# Patient Record
Sex: Female | Born: 1975 | Race: Black or African American | Hispanic: No | Marital: Single | Smoking: Current every day smoker
Health system: Southern US, Community
[De-identification: ages and names within clinical notes are randomized; demographics above are authoritative.]

## PROBLEM LIST (undated history)

## (undated) DIAGNOSIS — M549 Dorsalgia, unspecified: Secondary | ICD-10-CM

## (undated) DIAGNOSIS — J40 Bronchitis, not specified as acute or chronic: Secondary | ICD-10-CM

## (undated) DIAGNOSIS — G8929 Other chronic pain: Secondary | ICD-10-CM

## (undated) DIAGNOSIS — F329 Major depressive disorder, single episode, unspecified: Secondary | ICD-10-CM

## (undated) DIAGNOSIS — M1990 Unspecified osteoarthritis, unspecified site: Secondary | ICD-10-CM

## (undated) DIAGNOSIS — F32A Depression, unspecified: Secondary | ICD-10-CM

## (undated) HISTORY — PX: CHEST TUBE INSERTION: SHX231

## (undated) HISTORY — DX: Unspecified osteoarthritis, unspecified site: M19.90

## (undated) HISTORY — PX: HERNIA REPAIR: SHX51

## (undated) HISTORY — DX: Major depressive disorder, single episode, unspecified: F32.9

## (undated) HISTORY — DX: Depression, unspecified: F32.A

## (undated) HISTORY — PX: TRACHEOSTOMY CLOSURE: SHX6132

## (undated) HISTORY — PX: TRACHEOSTOMY: SUR1362

---

## 2014-06-02 ENCOUNTER — Encounter (HOSPITAL_COMMUNITY): Payer: Self-pay

## 2014-06-02 ENCOUNTER — Emergency Department (HOSPITAL_COMMUNITY)
Admission: EM | Admit: 2014-06-02 | Discharge: 2014-06-02 | Disposition: A | Discharge status: Home / Self Care | Payer: Medicare (Managed Care) | Attending: Emergency Medicine | Admitting: Emergency Medicine

## 2014-06-02 DIAGNOSIS — M25561 Pain in right knee: Secondary | ICD-10-CM | POA: Diagnosis not present

## 2014-06-02 DIAGNOSIS — E119 Type 2 diabetes mellitus without complications: Secondary | ICD-10-CM | POA: Diagnosis not present

## 2014-06-02 DIAGNOSIS — Y929 Unspecified place or not applicable: Secondary | ICD-10-CM | POA: Insufficient documentation

## 2014-06-02 DIAGNOSIS — Y999 Unspecified external cause status: Secondary | ICD-10-CM | POA: Diagnosis not present

## 2014-06-02 DIAGNOSIS — Y9389 Activity, other specified: Secondary | ICD-10-CM | POA: Diagnosis not present

## 2014-06-02 DIAGNOSIS — Z720 Tobacco use: Secondary | ICD-10-CM | POA: Diagnosis not present

## 2014-06-02 DIAGNOSIS — M549 Dorsalgia, unspecified: Secondary | ICD-10-CM | POA: Diagnosis present

## 2014-06-02 DIAGNOSIS — X58XXXA Exposure to other specified factors, initial encounter: Secondary | ICD-10-CM | POA: Diagnosis not present

## 2014-06-02 DIAGNOSIS — S39012A Strain of muscle, fascia and tendon of lower back, initial encounter: Secondary | ICD-10-CM | POA: Diagnosis not present

## 2014-06-02 DIAGNOSIS — M25562 Pain in left knee: Secondary | ICD-10-CM | POA: Diagnosis not present

## 2014-06-02 DIAGNOSIS — G8929 Other chronic pain: Secondary | ICD-10-CM | POA: Diagnosis not present

## 2014-06-02 MED ORDER — CYCLOBENZAPRINE HCL 10 MG PO TABS: 10.0000 mg | ORAL_TABLET | Freq: Two times a day (BID) | ORAL | Status: DC | PRN

## 2014-06-02 MED ORDER — MELOXICAM 15 MG PO TABS: 15.0000 mg | ORAL_TABLET | Freq: Every day | ORAL | Status: DC

## 2014-06-02 NOTE — ED Notes (Signed)
Patient states she has tried all OTC medicines and nothing works.   Patient states when cold and rainy, she hurts worse.
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
## 2014-06-02 NOTE — Discharge Instructions (Signed)
Take Mobic as needed for pain. Take flexeril as needed for muscle strain. Refer to attached documents for more information.

## 2014-06-11 ENCOUNTER — Emergency Department (HOSPITAL_COMMUNITY)
Admission: EM | Admit: 2014-06-11 | Discharge: 2014-06-11 | Disposition: A | Discharge status: Home / Self Care | Payer: Medicare (Managed Care) | Attending: Emergency Medicine | Admitting: Emergency Medicine

## 2014-06-11 ENCOUNTER — Encounter (HOSPITAL_COMMUNITY): Payer: Self-pay

## 2014-06-11 DIAGNOSIS — E119 Type 2 diabetes mellitus without complications: Secondary | ICD-10-CM | POA: Insufficient documentation

## 2014-06-11 DIAGNOSIS — M545 Low back pain: Secondary | ICD-10-CM | POA: Insufficient documentation

## 2014-06-11 DIAGNOSIS — Z87828 Personal history of other (healed) physical injury and trauma: Secondary | ICD-10-CM | POA: Diagnosis not present

## 2014-06-11 DIAGNOSIS — Z720 Tobacco use: Secondary | ICD-10-CM | POA: Insufficient documentation

## 2014-06-11 DIAGNOSIS — M549 Dorsalgia, unspecified: Secondary | ICD-10-CM

## 2014-06-11 DIAGNOSIS — Z791 Long term (current) use of non-steroidal anti-inflammatories (NSAID): Secondary | ICD-10-CM | POA: Diagnosis not present

## 2014-06-11 DIAGNOSIS — G8929 Other chronic pain: Secondary | ICD-10-CM | POA: Insufficient documentation

## 2014-06-11 MED ORDER — OXYCODONE-ACETAMINOPHEN 5-325 MG PO TABS
2.0000 | ORAL_TABLET | Freq: Once | ORAL | Status: AC
  Administered 2014-06-11: 2 via ORAL
  Filled 2014-06-11: qty 2

## 2014-06-11 MED ORDER — OXYCODONE-ACETAMINOPHEN 5-325 MG PO TABS: 1.0000 | ORAL_TABLET | Freq: Three times a day (TID) | ORAL | Status: DC | PRN

## 2014-06-11 NOTE — ED Notes (Signed)
PA Browning at bedside. 

## 2014-06-11 NOTE — Discharge Instructions (Signed)
Back Pain, Adult Low back pain is very common. About 1 in 5 people have back pain.The cause of low back pain is rarely dangerous. The pain often gets better over time.About half of people with a sudden onset of back pain feel better in just 2 weeks. About 8 in 10 people feel better by 6 weeks.  CAUSES Some common causes of back pain include:  Strain of the muscles or ligaments supporting the spine.  Wear and tear (degeneration) of the spinal discs.  Arthritis.  Direct injury to the back. DIAGNOSIS Most of the time, the direct cause of low back pain is not known.However, back pain can be treated effectively even when the exact cause of the pain is unknown.Answering your caregiver's questions about your overall health and symptoms is one of the most accurate ways to make sure the cause of your pain is not dangerous. If your caregiver needs more information, he or she may order lab work or imaging tests (X-rays or MRIs).However, even if imaging tests show changes in your back, this usually does not require surgery. HOME CARE INSTRUCTIONS For many people, back pain returns.Since low back pain is rarely dangerous, it is often a condition that people can learn to manageon their own.   Remain active. It is stressful on the back to sit or stand in one place. Do not sit, drive, or stand in one place for more than 30 minutes at a time. Take short walks on level surfaces as soon as pain allows.Try to increase the length of time you walk each day.  Do not stay in bed.Resting more than 1 or 2 days can delay your recovery.  Do not avoid exercise or work.Your body is made to move.It is not dangerous to be active, even though your back may hurt.Your back will likely heal faster if you return to being active before your pain is gone.  Pay attention to your body when you bend and lift. Many people have less discomfortwhen lifting if they bend their knees, keep the load close to their bodies,and  avoid twisting. Often, the most comfortable positions are those that put less stress on your recovering back.  Find a comfortable position to sleep. Use a firm mattress and lie on your side with your knees slightly bent. If you lie on your back, put a pillow under your knees.  Only take over-the-counter or prescription medicines as directed by your caregiver. Over-the-counter medicines to reduce pain and inflammation are often the most helpful.Your caregiver may prescribe muscle relaxant drugs.These medicines help dull your pain so you can more quickly return to your normal activities and healthy exercise.  Put ice on the injured area.  Put ice in a plastic bag.  Place a towel between your skin and the bag.  Leave the ice on for 15-20 minutes, 03-04 times a day for the first 2 to 3 days. After that, ice and heat may be alternated to reduce pain and spasms.  Ask your caregiver about trying back exercises and gentle massage. This may be of some benefit.  Avoid feeling anxious or stressed.Stress increases muscle tension and can worsen back pain.It is important to recognize when you are anxious or stressed and learn ways to manage it.Exercise is a great option. SEEK MEDICAL CARE IF:  You have pain that is not relieved with rest or medicine.  You have pain that does not improve in 1 week.  You have new symptoms.  You are generally not feeling well. SEEK   IMMEDIATE MEDICAL CARE IF:   You have pain that radiates from your back into your legs.  You develop new bowel or bladder control problems.  You have unusual weakness or numbness in your arms or legs.  You develop nausea or vomiting.  You develop abdominal pain.  You feel faint. Document Released: 07/10/2005 Document Revised: 01/09/2012 Document Reviewed: 11/11/2013 [employer] Patient Information 2015 [employer]. This information is not intended to replace advice given to you by your health care provider. Make sure you  discuss any questions you have with your health care provider.  
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
## 2014-06-11 NOTE — ED Notes (Signed)
Was seen here on 10th and given flexeril and mobic for back pain. Not having any relief.

## 2014-07-07 ENCOUNTER — Encounter (HOSPITAL_COMMUNITY): Payer: Self-pay | Admitting: Emergency Medicine

## 2014-07-07 ENCOUNTER — Emergency Department (INDEPENDENT_AMBULATORY_CARE_PROVIDER_SITE_OTHER)
Admission: EM | Admit: 2014-07-07 | Discharge: 2014-07-07 | Disposition: A | Discharge status: Home / Self Care | Payer: PRIVATE HEALTH INSURANCE | Source: Home / Self Care | Attending: Emergency Medicine | Admitting: Emergency Medicine

## 2014-07-07 DIAGNOSIS — M62838 Other muscle spasm: Secondary | ICD-10-CM

## 2014-07-07 DIAGNOSIS — M79661 Pain in right lower leg: Secondary | ICD-10-CM

## 2014-07-07 DIAGNOSIS — M546 Pain in thoracic spine: Secondary | ICD-10-CM

## 2014-07-07 MED ORDER — OXYCODONE-ACETAMINOPHEN 5-325 MG PO TABS: 1.0000 | ORAL_TABLET | Freq: Four times a day (QID) | ORAL | Status: DC | PRN

## 2014-07-07 MED ORDER — CYCLOBENZAPRINE HCL 5 MG PO TABS: 5.0000 mg | ORAL_TABLET | Freq: Three times a day (TID) | ORAL | Status: DC | PRN

## 2014-07-07 NOTE — Discharge Instructions (Signed)
Back Pain, Adult Low back pain is very common. About 1 in 5 people have back pain.The cause of low back pain is rarely dangerous. The pain often gets better over time.About half of people with a sudden onset of back pain feel better in just 2 weeks. About 8 in 10 people feel better by 6 weeks.  CAUSES Some common causes of back pain include:  Strain of the muscles or ligaments supporting the spine.  Wear and tear (degeneration) of the spinal discs.  Arthritis.  Direct injury to the back. DIAGNOSIS Most of the time, the direct cause of low back pain is not known.However, back pain can be treated effectively even when the exact cause of the pain is unknown.Answering your caregiver's questions about your overall health and symptoms is one of the most accurate ways to make sure the cause of your pain is not dangerous. If your caregiver needs more information, he or she may order lab work or imaging tests (X-rays or MRIs).However, even if imaging tests show changes in your back, this usually does not require surgery. HOME CARE INSTRUCTIONS For many people, back pain returns.Since low back pain is rarely dangerous, it is often a condition that people can learn to [facility] their own.   Remain active. It is stressful on the back to sit or stand in one place. Do not sit, drive, or stand in one place for more than 30 minutes at a time. Take short walks on level surfaces as soon as pain allows.Try to increase the length of time you walk each day.  Do not stay in bed.Resting more than 1 or 2 days can delay your recovery.  Do not avoid exercise or work.Your body is made to move.It is not dangerous to be active, even though your back may hurt.Your back will likely heal faster if you return to being active before your pain is gone.  Pay attention to your body when you bend and lift. Many people have less discomfortwhen lifting if they bend their knees, keep the load close to their bodies,and  avoid twisting. Often, the most comfortable positions are those that put less stress on your recovering back.  Find a comfortable position to sleep. Use a firm mattress and lie on your side with your knees slightly bent. If you lie on your back, put a pillow under your knees.  Only take over-the-counter or prescription medicines as directed by your caregiver. Over-the-counter medicines to reduce pain and inflammation are often the most helpful.Your caregiver may prescribe muscle relaxant drugs.These medicines help dull your pain so you can more quickly return to your normal activities and healthy exercise.  Put ice on the injured area.  Put ice in a plastic bag.  Place a towel between your skin and the bag.  Leave the ice on for 15-20 minutes, 03-04 times a day for the first 2 to 3 days. After that, ice and heat may be alternated to reduce pain and spasms.  Ask your caregiver about trying back exercises and gentle massage. This may be of some benefit.  Avoid feeling anxious or stressed.Stress increases muscle tension and can worsen back pain.It is important to recognize when you are anxious or stressed and learn ways to manage it.Exercise is a great option. SEEK MEDICAL CARE IF:  You have pain that is not relieved with rest or medicine.  You have pain that does not improve in 1 week.  You have new symptoms.  You are generally not feeling well. SEEK  IMMEDIATE MEDICAL CARE IF:   You have pain that radiates from your back into your legs.  You develop new bowel or bladder control problems.  You have unusual weakness or numbness in your arms or legs.  You develop nausea or vomiting.  You develop abdominal pain.  You feel faint. Document Released: 07/10/2005 Document Revised: 01/09/2012 Document Reviewed: 11/11/2013 [facility] Patient Information 2015 [place], [place]. This information is not intended to replace advice given to you by your health care provider. Make sure you  discuss any questions you have with your health care provider.  Chronic Back Pain  When back pain lasts longer than 3 months, it is called chronic back pain.People with chronic back pain often go through certain periods that are more intense (flare-ups).  CAUSES Chronic back pain can be caused by wear and tear (degeneration) on different structures in your back. These structures include:  The bones of your spine (vertebrae) and the joints surrounding your spinal cord and nerve roots (facets).  The strong, fibrous tissues that connect your vertebrae (ligaments). Degeneration of these structures may result in pressure on your nerves. This can lead to constant pain. HOME CARE INSTRUCTIONS  Avoid bending, heavy lifting, prolonged sitting, and activities which make the problem worse.  Take brief periods of rest throughout the day to reduce your pain. Lying down or standing usually is better than sitting while you are resting.  Take over-the-counter or prescription medicines only as directed by your caregiver. SEEK IMMEDIATE MEDICAL CARE IF:   You have weakness or numbness in one of your legs or feet.  You have trouble controlling your bladder or bowels.  You have nausea, vomiting, abdominal pain, shortness of breath, or fainting. Document Released: 08/17/2004 Document Revised: 10/02/2011 Document Reviewed: 06/24/2011 [facility] Patient Information 2015 [place], [place]. This information is not intended to replace advice given to you by your health care provider. Make sure you discuss any questions you have with your health care provider.  Heat Therapy Heat therapy can help make painful, stiff muscles and joints feel better. Do not use heat on new injuries. Wait at least 48 hours after an injury to use heat. Do not use heat when you have aches or pains right after an activity. If you still have pain 3 hours after stopping the activity, then you may use heat. HOME CARE Wet heat pack  Soak a  clean towel in warm water. Squeeze out the extra water.  Put the warm, wet towel in a plastic bag.  Place a thin, dry towel between your skin and the bag.  Put the heat pack on the area for 5 minutes, and check your skin. Your skin may be pink, but it should not be red.  Leave the heat pack on the area for 15 to 30 minutes.  Repeat this every 2 to 4 hours while awake. Do not use heat while you are sleeping. Warm water bath  Fill a tub with warm water.  Place the affected body part in the tub.  Soak the area for 20 to 40 minutes.  Repeat as needed. Hot water bottle  Fill the water bottle half full with hot water.  Press out the extra air. Close the cap tightly.  Place a dry towel between your skin and the bottle.  Put the bottle on the area for 5 minutes, and check your skin. Your skin may be pink, but it should not be red.  Leave the bottle on the area for 15 to 30  minutes.  Repeat this every 2 to 4 hours while awake. Electric heating pad  Place a dry towel between your skin and the heating pad.  Set the heating pad on low heat.  Put the heating pad on the area for 10 minutes, and check your skin. Your skin may be pink, but it should not be red.  Leave the heating pad on the area for 20 to 40 minutes.  Repeat this every 2 to 4 hours while awake.  Do not lie on the heating pad.  Do not fall asleep while using the heating pad.  Do not use the heating pad near water. GET HELP RIGHT AWAY IF:  You get blisters or red skin.  Your skin is puffy (swollen), or you lose feeling (numbness) in the affected area.  You have any new problems.  Your problems are getting worse.  You have any questions or concerns. If you have any problems, stop using heat therapy until you see your doctor. MAKE SURE YOU:  Understand these instructions.  Will watch your condition.  Will get help right away if you are not doing well or get worse. Document Released: 10/02/2011  Document Reviewed: 09/02/2013 [facility] Patient Information 2015 [place], [place]. This information is not intended to replace advice given to you by your health care provider. Make sure you discuss any questions you have with your health care provider.  Muscle Cramps and Spasms Muscle cramps and spasms occur when a muscle or muscles tighten and you have no control over this tightening (involuntary muscle contraction). They are a common problem and can develop in any muscle. The most common place is in the calf muscles of the leg. Both muscle cramps and muscle spasms are involuntary muscle contractions, but they also have differences:   Muscle cramps are sporadic and painful. They may last a few seconds to a quarter of an hour. Muscle cramps are often more forceful and last longer than muscle spasms.  Muscle spasms may or may not be painful. They may also last just a few seconds or much longer. CAUSES  It is uncommon for cramps or spasms to be due to a serious underlying problem. In many cases, the cause of cramps or spasms is unknown. Some common causes are:   Overexertion.   Overuse from repetitive motions (doing the same thing over and over).   Remaining in a certain position for a long period of time.   Improper preparation, form, or technique while performing a sport or activity.   Dehydration.   Injury.   Side effects of some medicines.   Abnormally low levels of the salts and ions in your blood (electrolytes), especially potassium and calcium. This could happen if you are taking water pills (diuretics) or you are pregnant.  Some underlying medical problems can make it more likely to develop cramps or spasms. These include, but are not limited to:   Diabetes.   Parkinson disease.   Hormone disorders, such as thyroid problems.   Alcohol abuse.   Diseases specific to muscles, joints, and bones.   Blood vessel disease where not enough blood is getting to the  muscles.  HOME CARE INSTRUCTIONS   Stay well hydrated. Drink enough water and fluids to keep your urine clear or pale yellow.  It may be helpful to massage, stretch, and relax the affected muscle.  For tight or tense muscles, use a warm towel, heating pad, or hot shower water directed to the affected area.  If you are  sore or have pain after a cramp or spasm, applying ice to the affected area may relieve discomfort.  Put ice in a plastic bag.  Place a towel between your skin and the bag.  Leave the ice on for 15-20 minutes, 03-04 times a day.  Medicines used to treat a known cause of cramps or spasms may help reduce their frequency or severity. Only take over-the-counter or prescription medicines as directed by your caregiver. SEEK MEDICAL CARE IF:  Your cramps or spasms get more severe, more frequent, or do not improve over time.  MAKE SURE YOU:   Understand these instructions.  Will watch your condition.  Will get help right away if you are not doing well or get worse. Document Released: 12/30/2001 Document Revised: 11/04/2012 Document Reviewed: 06/26/2012 [facility] Patient Information 2015 [place], [place]. This information is not intended to replace advice given to you by your health care provider. Make sure you discuss any questions you have with your health care provider.
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
## 2014-08-08 ENCOUNTER — Encounter (HOSPITAL_COMMUNITY): Payer: Self-pay | Admitting: Emergency Medicine

## 2014-08-08 ENCOUNTER — Emergency Department (HOSPITAL_COMMUNITY)
Admission: EM | Admit: 2014-08-08 | Discharge: 2014-08-08 | Disposition: A | Discharge status: Home / Self Care | Payer: Medicare Other | Attending: Emergency Medicine | Admitting: Emergency Medicine

## 2014-08-08 ENCOUNTER — Emergency Department (HOSPITAL_COMMUNITY): Payer: Medicare Other

## 2014-08-08 DIAGNOSIS — Z791 Long term (current) use of non-steroidal anti-inflammatories (NSAID): Secondary | ICD-10-CM | POA: Diagnosis not present

## 2014-08-08 DIAGNOSIS — R110 Nausea: Secondary | ICD-10-CM | POA: Diagnosis not present

## 2014-08-08 DIAGNOSIS — Z79891 Long term (current) use of opiate analgesic: Secondary | ICD-10-CM | POA: Insufficient documentation

## 2014-08-08 DIAGNOSIS — E119 Type 2 diabetes mellitus without complications: Secondary | ICD-10-CM | POA: Diagnosis not present

## 2014-08-08 DIAGNOSIS — Z720 Tobacco use: Secondary | ICD-10-CM | POA: Diagnosis not present

## 2014-08-08 DIAGNOSIS — Z3202 Encounter for pregnancy test, result negative: Secondary | ICD-10-CM | POA: Insufficient documentation

## 2014-08-08 DIAGNOSIS — Z87828 Personal history of other (healed) physical injury and trauma: Secondary | ICD-10-CM | POA: Diagnosis not present

## 2014-08-08 DIAGNOSIS — K439 Ventral hernia without obstruction or gangrene: Secondary | ICD-10-CM | POA: Diagnosis not present

## 2014-08-08 DIAGNOSIS — Z79899 Other long term (current) drug therapy: Secondary | ICD-10-CM | POA: Insufficient documentation

## 2014-08-08 DIAGNOSIS — R102 Pelvic and perineal pain: Secondary | ICD-10-CM | POA: Diagnosis present

## 2014-08-08 LAB — COMPREHENSIVE METABOLIC PANEL
ALK PHOS: 97 U/L (ref 39–117)
ALT: 15 U/L (ref 0–35)
ANION GAP: 9 (ref 5–15)
AST: 21 U/L (ref 0–37)
Albumin: 3.8 g/dL (ref 3.5–5.2)
BUN: 5 mg/dL — ABNORMAL LOW (ref 6–23)
CO2: 24 mmol/L (ref 19–32)
Calcium: 9.1 mg/dL (ref 8.4–10.5)
Chloride: 106 mEq/L (ref 96–112)
Creatinine, Ser: 0.67 mg/dL (ref 0.50–1.10)
GFR calc Af Amer: >90 mL/min (ref >90)
GFR calc non Af Amer: >90 mL/min (ref >90)
GLUCOSE: 134 mg/dL — AB (ref 70–99)
POTASSIUM: 3.5 mmol/L (ref 3.5–5.1)
Sodium: 139 mmol/L (ref 135–145)
Total Bilirubin: 0.4 mg/dL (ref 0.3–1.2)
Total Protein: 6.3 g/dL (ref 6.0–8.3)

## 2014-08-08 LAB — PREGNANCY, URINE: Preg Test, Ur: NEGATIVE

## 2014-08-08 LAB — CBC WITH DIFFERENTIAL/PLATELET
BASOS PCT: 0 % (ref 0–1)
Basophils Absolute: 0 10*3/uL (ref 0.0–0.1)
Eosinophils Absolute: 0.1 10*3/uL (ref 0.0–0.7)
Eosinophils Relative: 2 % (ref 0–5)
HCT: 36 % (ref 36.0–46.0)
Hemoglobin: 12.1 g/dL (ref 12.0–15.0)
LYMPHS ABS: 2.4 10*3/uL (ref 0.7–4.0)
Lymphocytes Relative: 32 % (ref 12–46)
MCH: 30 pg (ref 26.0–34.0)
MCHC: 33.6 g/dL (ref 30.0–36.0)
MCV: 89.3 fL (ref 78.0–100.0)
MONO ABS: 0.6 10*3/uL (ref 0.1–1.0)
MONOS PCT: 8 % (ref 3–12)
NEUTROS PCT: 58 % (ref 43–77)
Neutro Abs: 4.4 10*3/uL (ref 1.7–7.7)
Platelets: 242 10*3/uL (ref 150–400)
RBC: 4.03 MIL/uL (ref 3.87–5.11)
RDW: 13 % (ref 11.5–15.5)
WBC: 7.4 10*3/uL (ref 4.0–10.5)

## 2014-08-08 LAB — URINALYSIS, ROUTINE W REFLEX MICROSCOPIC
BILIRUBIN URINE: NEGATIVE
Glucose, UA: NEGATIVE mg/dL
KETONES UR: NEGATIVE mg/dL
LEUKOCYTES UA: NEGATIVE
Nitrite: NEGATIVE
PH: 7 (ref 5.0–8.0)
Protein, ur: NEGATIVE mg/dL
Specific Gravity, Urine: 1.026 (ref 1.005–1.030)
Urobilinogen, UA: 1 mg/dL (ref 0.0–1.0)

## 2014-08-08 LAB — URINE MICROSCOPIC-ADD ON

## 2014-08-08 MED ORDER — DOXYCYCLINE HYCLATE 100 MG PO CAPS: 100.0000 mg | ORAL_CAPSULE | Freq: Two times a day (BID) | ORAL | Status: DC

## 2014-08-08 MED ORDER — HYDROMORPHONE HCL 1 MG/ML IJ SOLN
1.0000 mg | Freq: Once | INTRAMUSCULAR | Status: AC
  Administered 2014-08-08: 1 mg via INTRAVENOUS
  Filled 2014-08-08: qty 1

## 2014-08-08 MED ORDER — SODIUM CHLORIDE 0.9 % IV BOLUS (SEPSIS)
1000.0000 mL | Freq: Once | INTRAVENOUS | Status: AC
  Administered 2014-08-08: 1000 mL via INTRAVENOUS

## 2014-08-08 MED ORDER — IOHEXOL 300 MG/ML  SOLN
100.0000 mL | Freq: Once | INTRAMUSCULAR | Status: AC | PRN
  Administered 2014-08-08: 100 mL via INTRAVENOUS

## 2014-08-08 MED ORDER — OXYCODONE-ACETAMINOPHEN 5-325 MG PO TABS: 1.0000 | ORAL_TABLET | Freq: Two times a day (BID) | ORAL | Status: DC | PRN

## 2014-08-08 MED ORDER — AMOXICILLIN-POT CLAVULANATE 875-125 MG PO TABS: 1.0000 | ORAL_TABLET | Freq: Two times a day (BID) | ORAL | Status: DC

## 2014-08-08 MED ORDER — DOXYCYCLINE HYCLATE 100 MG PO TABS
100.0000 mg | ORAL_TABLET | Freq: Once | ORAL | Status: AC
  Administered 2014-08-08: 100 mg via ORAL
  Filled 2014-08-08: qty 1

## 2014-08-08 MED ORDER — AMOXICILLIN-POT CLAVULANATE 875-125 MG PO TABS
1.0000 | ORAL_TABLET | Freq: Once | ORAL | Status: AC
  Administered 2014-08-08: 1 via ORAL
  Filled 2014-08-08: qty 1

## 2014-08-08 NOTE — ED Notes (Signed)
Patient here with complaint of pelvic pain with swollen area on left side of groin. States history of 6x hernia repairs with most recent in 2014. States nausea. Denies vomiting and fevers. Last BM 08/07/14
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
## 2014-08-08 NOTE — Discharge Instructions (Signed)
Wound Infection Ms. Joanne Daugherty, you were seen today for abdominal pain. Your CT scan shows a soft tissue infection of your abdomen. Take antibiotics as prescribed and follow-up with general surgery within 3 days for continued management. If your symptoms worsen come back to emergency department immediately. Thank you. A wound infection happens when a type of germ (bacteria) grows in a wound. Caring for the infection can help the wound heal. Wound infections need treatment. HOME CARE   Only take medicine as told by your doctor.  Take your antibiotic medicine as told. Finish it even if you start to feel better.  Clean the wound with mild soap and water as told. Rinse the soap off. Pat the area dry with a clean towel. Do not rub the wound.  Change any bandages (dressings) as told by your doctor.  Put cream and a bandage on the wound as told by your doctor.  If the bandage sticks, wet it with soapy water to remove the bandage.  Change the bandage if it gets wet, dirty, or starts to smell.  Take showers. Do not take baths, swim, or do anything that puts your wound under water.  Avoid exercise that makes you sweat.  If your wound itches, use a medicine that helps stop itching. Do not pick or scratch at the wound.  Keep all doctor visits as told. GET HELP RIGHT AWAY IF:   You have more puffiness (swelling), pain, or redness around the wound.  You have more yellowish-white fluid (pus) coming from the wound.  You have a bad smell coming from the wound.  Your wound breaks open more.  You have a fever. MAKE SURE YOU:   Understand these instructions.  Will watch your condition.  Will get help right away if you are not doing well or get worse. Document Released: 04/18/2008 Document Revised: 10/02/2011 Document Reviewed: 12/19/2010 [facility] Patient Information 2015 [employer] [place]. This information is not intended to replace advice given to you by your health care provider. Make sure you  discuss any questions you have with your health care provider.

## 2014-11-15 ENCOUNTER — Emergency Department (HOSPITAL_COMMUNITY)
Admission: EM | Admit: 2014-11-15 | Discharge: 2014-11-15 | Disposition: A | Discharge status: Home / Self Care | Payer: Medicare Other | Attending: Emergency Medicine | Admitting: Emergency Medicine

## 2014-11-15 ENCOUNTER — Encounter (HOSPITAL_COMMUNITY): Payer: Self-pay | Admitting: *Deleted

## 2014-11-15 DIAGNOSIS — M5441 Lumbago with sciatica, right side: Secondary | ICD-10-CM | POA: Diagnosis not present

## 2014-11-15 DIAGNOSIS — Z87828 Personal history of other (healed) physical injury and trauma: Secondary | ICD-10-CM | POA: Diagnosis not present

## 2014-11-15 DIAGNOSIS — E119 Type 2 diabetes mellitus without complications: Secondary | ICD-10-CM | POA: Diagnosis not present

## 2014-11-15 DIAGNOSIS — M545 Low back pain: Secondary | ICD-10-CM | POA: Diagnosis present

## 2014-11-15 DIAGNOSIS — G8929 Other chronic pain: Secondary | ICD-10-CM | POA: Insufficient documentation

## 2014-11-15 DIAGNOSIS — M5442 Lumbago with sciatica, left side: Secondary | ICD-10-CM | POA: Insufficient documentation

## 2014-11-15 DIAGNOSIS — Z720 Tobacco use: Secondary | ICD-10-CM | POA: Diagnosis not present

## 2014-11-15 DIAGNOSIS — Z792 Long term (current) use of antibiotics: Secondary | ICD-10-CM | POA: Insufficient documentation

## 2014-11-15 MED ORDER — METHOCARBAMOL 500 MG PO TABS: 1000.0000 mg | ORAL_TABLET | Freq: Four times a day (QID) | ORAL | Status: DC

## 2014-11-15 MED ORDER — OXYCODONE-ACETAMINOPHEN 5-325 MG PO TABS
2.0000 | ORAL_TABLET | Freq: Once | ORAL | Status: AC
  Administered 2014-11-15: 2 via ORAL
  Filled 2014-11-15: qty 2

## 2014-11-15 MED ORDER — IBUPROFEN 800 MG PO TABS: 800.0000 mg | ORAL_TABLET | Freq: Three times a day (TID) | ORAL | Status: DC

## 2014-11-15 NOTE — Discharge Instructions (Signed)
Sciatica °Sciatica is pain, weakness, numbness, or tingling along the path of the sciatic nerve. The nerve starts in the lower back and runs down the back of each leg. The nerve controls the muscles in the lower leg and in the back of the knee, while also providing sensation to the back of the thigh, lower leg, and the sole of your foot. Sciatica is a symptom of another medical condition. For instance, nerve damage or certain conditions, such as a herniated disk or bone spur on the spine, pinch or put pressure on the sciatic nerve. This causes the pain, weakness, or other sensations normally associated with sciatica. Generally, sciatica only affects one side of the body. °CAUSES  °· Herniated or slipped disc. °· Degenerative disk disease. °· A pain disorder involving the narrow muscle in the buttocks (piriformis syndrome). °· Pelvic injury or fracture. °· Pregnancy. °· Tumor (rare). °SYMPTOMS  °Symptoms can vary from mild to very severe. The symptoms usually travel from the low back to the buttocks and down the back of the leg. Symptoms can include: °· Mild tingling or dull aches in the lower back, leg, or hip. °· Numbness in the back of the calf or sole of the foot. °· Burning sensations in the lower back, leg, or hip. °· Sharp pains in the lower back, leg, or hip. °· Leg weakness. °· Severe back pain inhibiting movement. °These symptoms may get worse with coughing, sneezing, laughing, or prolonged sitting or standing. Also, being overweight may worsen symptoms. °DIAGNOSIS  °Your caregiver will perform a physical exam to look for common symptoms of sciatica. He or she may ask you to do certain movements or activities that would trigger sciatic nerve pain. Other tests may be performed to find the cause of the sciatica. These may include: °· Blood tests. °· X-rays. °· Imaging tests, such as an MRI or CT scan. °TREATMENT  °Treatment is directed at the cause of the sciatic pain. Sometimes, treatment is not necessary  and the pain and discomfort goes away on its own. If treatment is needed, your caregiver may suggest: °· Over-the-counter medicines to relieve pain. °· Prescription medicines, such as anti-inflammatory medicine, muscle relaxants, or narcotics. °· Applying heat or ice to the painful area. °· Steroid injections to lessen pain, irritation, and inflammation around the nerve. °· Reducing activity during periods of pain. °· Exercising and stretching to strengthen your abdomen and improve flexibility of your spine. Your caregiver may suggest losing weight if the extra weight makes the back pain worse. °· Physical therapy. °· Surgery to eliminate what is pressing or pinching the nerve, such as a bone spur or part of a herniated disk. °HOME CARE INSTRUCTIONS  °· Only take over-the-counter or prescription medicines for pain or discomfort as directed by your caregiver. °· Apply ice to the affected area for 20 minutes, 3-4 times a day for the first 48-72 hours. Then try heat in the same way. °· Exercise, stretch, or perform your usual activities if these do not aggravate your pain. °· Attend physical therapy sessions as directed by your caregiver. °· Keep all follow-up appointments as directed by your caregiver. °· Do not wear high heels or shoes that do not provide proper support. °· Check your mattress to see if it is too soft. A firm mattress may lessen your pain and discomfort. °SEEK IMMEDIATE MEDICAL CARE IF:  °· You lose control of your bowel or bladder (incontinence). °· You have increasing weakness in the lower back, pelvis, buttocks,   or legs. °· You have redness or swelling of your back. °· You have a burning sensation when you urinate. °· You have pain that gets worse when you lie down or awakens you at night. °· Your pain is worse than you have experienced in the past. °· Your pain is lasting longer than 4 weeks. °· You are suddenly losing weight without reason. °MAKE SURE YOU: °· Understand these  instructions. °· Will watch your condition. °· Will get help right away if you are not doing well or get worse. °Document Released: 07/04/2001 Document Revised: 01/09/2012 Document Reviewed: 11/19/2011 °ExitCare® Patient Information ©2015 [employer], LLC. This information is not intended to replace advice given to you by your health care provider. Make sure you discuss any questions you have with your health care provider. ° ° °Back Exercises °Back exercises help treat and prevent back injuries. The goal of back exercises is to increase the strength of your abdominal and back muscles and the flexibility of your back. These exercises should be started when you no longer have back pain. Back exercises include: °· Pelvic Tilt. Lie on your back with your knees bent. Tilt your pelvis until the lower part of your back is against the floor. Hold this position 5 to 10 sec and repeat 5 to 10 times. °· Knee to Chest. Pull first 1 knee up against your chest and hold for 20 to 30 seconds, repeat this with the other knee, and then both knees. This may be done with the other leg straight or bent, whichever feels better. °· Sit-Ups or Curl-Ups. Bend your knees 90 degrees. Start with tilting your pelvis, and do a partial, slow sit-up, lifting your trunk only 30 to 45 degrees off the floor. Take at least 2 to 3 seconds for each sit-up. Do not do sit-ups with your knees out straight. If partial sit-ups are difficult, simply do the above but with only tightening your abdominal muscles and holding it as directed. °· Hip-Lift. Lie on your back with your knees flexed 90 degrees. Push down with your feet and shoulders as you raise your hips a couple inches off the floor; hold for 10 seconds, repeat 5 to 10 times. °· Back arches. Lie on your stomach, propping yourself up on bent elbows. Slowly press on your hands, causing an arch in your low back. Repeat 3 to 5 times. Any initial stiffness and discomfort should lessen with repetition over  time. °· Shoulder-Lifts. Lie face down with arms beside your body. Keep hips and torso pressed to floor as you slowly lift your head and shoulders off the floor. °Do not overdo your exercises, especially in the beginning. Exercises may cause you some mild back discomfort which lasts for a few minutes; however, if the pain is more severe, or lasts for more than 15 minutes, do not continue exercises until you see your caregiver. Improvement with exercise therapy for back problems is slow.  °See your caregivers for assistance with developing a proper back exercise program. °Document Released: 08/17/2004 Document Revised: 10/02/2011 Document Reviewed: 05/11/2011 °ExitCare® Patient Information ©2015 ExitCare, LLC. This information is not intended to replace advice given to you by your health care provider. Make sure you discuss any questions you have with your health care provider. ° ° °Emergency Department Resource Guide °1) Find a Doctor and Pay Out of Pocket °Although you won't have to find out who is covered by your insurance plan, it is a good idea to ask around and get recommendations. You   will then need to call the office and see if the doctor you have chosen will accept you as a new patient and what types of options they offer for patients who are self-pay. Some doctors offer discounts or will set up payment plans for their patients who do not have insurance, but you will need to ask so you aren't surprised when you get to your appointment. ° °2) Contact Your Local Health Department °Not all health departments have doctors that can see patients for sick visits, but many do, so it is worth a call to see if yours does. If you don't know where your local health department is, you can check in your phone book. The CDC also has a tool to help you locate your state's health department, and many state websites also have listings of all of their local health departments. ° °3) Find a Walk-in Clinic °If your illness is  not likely to be very severe or complicated, you may want to try a walk in clinic. These are popping up all over the country in pharmacies, drugstores, and shopping centers. They're usually staffed by nurse practitioners or physician assistants that have been trained to treat common illnesses and complaints. They're usually fairly quick and inexpensive. However, if you have serious medical issues or chronic medical problems, these are probably not your best option. ° °No Primary Care Doctor: °- Call [facility] at  832-8000 - they can help you locate a primary care doctor that  accepts your insurance, provides certain services, etc. °- Physician Referral Service- 1-800-533-3463 ° °Chronic Pain Problems: °Organization         Address  Phone   Notes  °[facility]  (336) 297-2271 Patients need to be referred by their primary care doctor.  ° °Medication Assistance: °Organization         Address  Phone   Notes  °[place] [facility] [address] °[place], [place] [address] (336) 641-8030 --Must be a resident of [place] °-- Must have NO insurance coverage whatsoever (no Medicaid/ Medicare, etc.) °-- The pt. MUST have a primary care doctor that directs their care regularly and follows them in the community °  °[facility]  (866) 331-1348   °[employer]  (888) 892-1162   ° °Agencies that provide inexpensive medical care: °Organization         Address  Phone   Notes  °[facility]  (336) 832-8035   °[facility]    (336) 832-7272   °[facility] [address] °[place], [place] [address] (336) 832-4777   °[facility] [address], °[place] (336) 271-4999   °[facility]    (336) 373-0678   °[facility]    (336) 272-1050   °[facility] ° [address], [place] Phone:  (336) 832-4444, Fax:  (336) 832-4440 Hours of Operation:  9 am - 6  pm, M-F.  Also accepts Medicaid/Medicare and self-pay.  °[facility] ° [address], [place] Phone: (336) [phone], Fax: (336) [phone]. Hours of Operation:  8:30 am - 5:30 pm, M-F.  Also accepts Medicaid and self-pay.  °[facility] [place] [address], [place] Phone: (336) 878-6027   °[facility] [address], [place],  (336)723-1848, Ext. 123 Mondays & Thursdays: 7-9 AM.  First 15 patients are seen on a first come, first   serve basis. °  ° °Medicaid-accepting [place] Providers: ° °Organization         Address  Phone   Notes  °[facility] [address], [place] (336) 641-2100 Also accepts self-pay patients.  °[facility] [address], [place] ° (336) 856-9996   °[facility] [address], [place] (336) 288-8857   °[facility] [address] [place] [address], [place] (336) 299-7000   °Veita Bland [address], [place]  ° (336) 373-1557 Only accepts [place] Access Medicaid patients after they have their name applied to their card.  ° °Self-Pay (no insurance) in [place]: ° °Organization         Address  Phone   Notes  °Sickle Cell Patients, [facility] [address], [place] (336) 832-1970   °[facility] [address], [place] (336) 832-4400   °[facility] ° [address], [place] (336) 992-4800   °[facility]/Dr. Osei-Bonsu ° [address] [place] [address], [place] or [address], [place] (336) [phone] Phone number for both [place] and [place] locations is the same.  °[facility] [address], [place] (336) 299-0000   °[facility] [address] [place] [address], [place] or [address] (336) 852-7530 °(336) 878-2260   °[facility] [address], [place] (336) 350-1642, phone; (336) 294-5005, fax Sees patients 1st and 3rd Saturday of every month.  Must not qualify for public or private insurance (i.e. Medicaid, Medicare, [place] Health Choice, Veterans' Benefits) • Household income should be no more than 200% of the poverty level •The clinic cannot treat you if you are pregnant or think you are pregnant • Sexually transmitted diseases are not treated at the clinic.  ° ° °Dental Care: °Organization         Address  Phone  Notes  °[facility] [address], [place] (336) 641-6152 Accepts children up to age 21 who are enrolled in Medicaid or [place] Health Choice; pregnant women with a Medicaid card; and children who have applied for Medicaid or [place] Health Choice, but were declined, whose parents can pay a reduced fee at time of service.  °[facility] [place]  [address], [place] (336) 641-7733 Accepts children up to age 21 who are enrolled in Medicaid or [place] Health Choice; pregnant women with a Medicaid card; and children who have applied for Medicaid or [place] Health Choice, but were declined, whose parents can pay a reduced fee at time of service.  °[facility] ° [address], [place] (336) 641-4533 Patients are seen by appointment only. Walk-ins are not accepted. [facility] will see patients 18 years of age and older. °Monday - Tuesday (8am-5pm) °Most Wednesdays (8:30-5pm) °$30 per visit, cash only  °[facility] ° [address], [place] (336) 641-4533 Patients are seen by appointment only. Walk-ins are not accepted. [facility] will see patients 18 years of age and older. °One Wednesday Evening (Monthly: Volunteer Based).  $30 per visit, cash only  °[facility]  (919) [phone] for adults; Children under age 4, call Graduate Pediatric Dentistry at ([phone]  [phone]. Children aged 4-14, please call (  [phone]) [phone] to request a pediatric application. ° Dental services are provided in all areas of dental care including fillings, crowns and bridges, complete and partial dentures, implants, gum treatment, root canals, and extractions. Preventive care is also provided. Treatment is provided to both adults and children. °Patients are selected via a lottery and there is often a waiting list. °  °[facility] [address], °[place] ° (336) 763-8833 [email or web address] °  °[facility] [address], [place], [place] (336)723-1848, Ext. 123 Second and Fourth Thursday of each month, opens at 6:30 AM; Clinic ends at 9 AM.  Patients are seen on a first-come first-served basis, and a limited number are seen during each clinic.  ° °[facility] ° [address], [place], [place] (336) 723-7904   Eligibility Requirements °You must have lived in [place], or [place] counties for at least the last three months. °  You cannot be eligible for state or federal sponsored healthcare insurance, including Veterans Administration, Medicaid, or Medicare. °  You generally cannot be eligible for healthcare insurance through your employer.  °  How to apply: °Eligibility screenings are held every Tuesday and Wednesday afternoon from 1:00 pm until 4:00 pm. You do not need an appointment for the interview!  °[facility] [address], [place], [place] 336-631-2330   °[facility]  336-342-8273   °[facility]  336-703-3100   °[facility]  336-570-6415   ° °Behavioral Health Resources in the Community: °Intensive Outpatient Programs °Organization         Address  Phone  Notes  °[facility] [address], [place], [place] 336-878-6098   °[facility] [address], [place], [place] 336-832-9800   °[facility]  [address], [place], [place] ° 336-882-2125   °[facility] [address],  °[place], [place] 1-800-853-5163 or 336-641-4981   °Substance Abuse Resources °Organization         Address  Phone  Notes  °[facility]  336-882-2125   °[facility]  336-784-9470   °[facility]  336-285-9073   °[facility]  336-845-3988   °Residential & Outpatient Substance Abuse Program  1-800-659-3381   °Psychological Services °Organization         Address  Phone  Notes  °[facility]  336- 832-9600   °[facility]  336- 378-7881   °[facility] [address], [place] 1-800-853-5163 or 336-641-4981   ° °Mobile Crisis Teams °Organization         Address  Phone  Notes  °[facility], Mobile Crisis Care Unit  1-877-626-1772   °Assertive °[facility] ° [address] [place], [place] 336-834-9664   °Sharon DeEsch [address] °[place] 336-554-5454   ° °Self-Help/Support Groups °Organization         Address  Phone             Notes  °[facility] - variety of support groups  336- 373-1402 Call for more information  °Narcotics Anonymous (NA), [facility] [address], °[place]  2 meetings at this location  ° °Residential Treatment Programs °Organization         Address  Phone  Notes  °[facility] [address],    °[place]  1-866-801-8205   °[facility] °   [address], [place], [place] 704-293-8524   °[facility] [address], [place] 336-845-3988 Admissions: 8am-3pm M-F  °[facility] [address]    °[place], [place] 336-841-1104   °[facility] [address], [place], [place] 336-379-7146   °[facility] [address]  °[place], [place] 336-285-9073   °[facility] - Intensive Outpatient [address], [place], [place]  336-852-3033   °[facility] [address]  °[place], [place] 1-877-615-2722 or 336-784-9470   °[facility] [address] [place], [place] 336-227-7417 Accepts Medicaid  °[facility] [address]  °[place] 1-800-659-3381 Substance Abuse/Addiction Treatment  ° °[place] Behavioral Health Resources °Organization         Address  Phone  Notes  °[facility]  (888) 581-9988   °Julie Brannon, PhD [address] [place], [place]   (336) 349-5553 or (336) 951-0000   °[facility]   [address] °[place], [place] (336) 349-4454   °[facility] [address], [place], [place] (336) 342-8316 Insurance/Medicaid/sponsorship through [facility]  °[facility] [address]                                    [place], [place] (336) 342-8316 Therapy/tele-psych/case  °[facility] [address]  ° [place], [place] (336) 349-2233    °Dr. Arfeen  (336) 349-4544   °[facility] [place]  [employer] [place] [facility] 1) [address], [place] °2) [address], [place] °3)  [address], [place] (336) 349-3220 °(336) 342-7768 ° °(336) 342-8140   °[place] [facility] (336) 342-1394 or (336) 342-3537 (After Hours)    ° ° ° °
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
## 2014-11-15 NOTE — ED Notes (Signed)
Declined W/C at D/C and was escorted to lobby by RN. 

## 2014-11-15 NOTE — ED Notes (Signed)
Pt reports after moving furniture on 11-07-14 her back started to hurt. Back pain radiates down BLE. Pt reports taking OTC with out relief.

## 2014-12-01 ENCOUNTER — Emergency Department (HOSPITAL_COMMUNITY)
Admission: EM | Admit: 2014-12-01 | Discharge: 2014-12-01 | Disposition: A | Discharge status: Home / Self Care | Payer: Medicare Other | Attending: Emergency Medicine | Admitting: Emergency Medicine

## 2014-12-01 ENCOUNTER — Emergency Department (HOSPITAL_COMMUNITY): Payer: Medicare Other

## 2014-12-01 ENCOUNTER — Encounter (HOSPITAL_COMMUNITY): Payer: Self-pay | Admitting: Cardiology

## 2014-12-01 DIAGNOSIS — M549 Dorsalgia, unspecified: Secondary | ICD-10-CM | POA: Insufficient documentation

## 2014-12-01 DIAGNOSIS — E119 Type 2 diabetes mellitus without complications: Secondary | ICD-10-CM | POA: Diagnosis not present

## 2014-12-01 DIAGNOSIS — Z79899 Other long term (current) drug therapy: Secondary | ICD-10-CM | POA: Insufficient documentation

## 2014-12-01 DIAGNOSIS — R42 Dizziness and giddiness: Secondary | ICD-10-CM | POA: Diagnosis not present

## 2014-12-01 DIAGNOSIS — R079 Chest pain, unspecified: Secondary | ICD-10-CM | POA: Insufficient documentation

## 2014-12-01 DIAGNOSIS — Z720 Tobacco use: Secondary | ICD-10-CM | POA: Insufficient documentation

## 2014-12-01 DIAGNOSIS — R110 Nausea: Secondary | ICD-10-CM | POA: Insufficient documentation

## 2014-12-01 DIAGNOSIS — E876 Hypokalemia: Secondary | ICD-10-CM | POA: Insufficient documentation

## 2014-12-01 DIAGNOSIS — G8929 Other chronic pain: Secondary | ICD-10-CM | POA: Insufficient documentation

## 2014-12-01 DIAGNOSIS — Z87828 Personal history of other (healed) physical injury and trauma: Secondary | ICD-10-CM | POA: Diagnosis not present

## 2014-12-01 DIAGNOSIS — R0602 Shortness of breath: Secondary | ICD-10-CM | POA: Diagnosis not present

## 2014-12-01 LAB — BASIC METABOLIC PANEL
Anion gap: 13 (ref 5–15)
BUN: 9 mg/dL (ref 6–20)
CHLORIDE: 107 mmol/L (ref 101–111)
CO2: 23 mmol/L (ref 22–32)
Calcium: 9.7 mg/dL (ref 8.9–10.3)
Creatinine, Ser: 0.7 mg/dL (ref 0.44–1.00)
GFR calc Af Amer: >60 mL/min (ref >60)
GLUCOSE: 91 mg/dL (ref 70–99)
POTASSIUM: 3 mmol/L — AB (ref 3.5–5.1)
Sodium: 143 mmol/L (ref 135–145)

## 2014-12-01 LAB — CBC
HCT: 40.1 % (ref 36.0–46.0)
Hemoglobin: 13.4 g/dL (ref 12.0–15.0)
MCH: 30.5 pg (ref 26.0–34.0)
MCHC: 33.4 g/dL (ref 30.0–36.0)
MCV: 91.3 fL (ref 78.0–100.0)
PLATELETS: 252 10*3/uL (ref 150–400)
RBC: 4.39 MIL/uL (ref 3.87–5.11)
RDW: 13.1 % (ref 11.5–15.5)
WBC: 8.5 10*3/uL (ref 4.0–10.5)

## 2014-12-01 LAB — I-STAT TROPONIN, ED
TROPONIN I, POC: 0 ng/mL (ref 0.00–0.08)
Troponin i, poc: 0 ng/mL (ref 0.00–0.08)

## 2014-12-01 LAB — BRAIN NATRIURETIC PEPTIDE: B Natriuretic Peptide: 228.4 pg/mL — ABNORMAL HIGH (ref 0.0–100.0)

## 2014-12-01 MED ORDER — POTASSIUM CHLORIDE CRYS ER 20 MEQ PO TBCR
40.0000 meq | EXTENDED_RELEASE_TABLET | Freq: Once | ORAL | Status: AC
  Administered 2014-12-01: 40 meq via ORAL
  Filled 2014-12-01: qty 2

## 2014-12-01 MED ORDER — OXYCODONE-ACETAMINOPHEN 5-325 MG PO TABS
1.0000 | ORAL_TABLET | Freq: Once | ORAL | Status: AC
  Administered 2014-12-01: 1 via ORAL
  Filled 2014-12-01: qty 1

## 2014-12-01 NOTE — ED Notes (Signed)
Dr. Jacubowitz at bedside 

## 2014-12-01 NOTE — ED Provider Notes (Signed)
Complained of anterior chest pain nonradiating onset 3:15 PM today. Pain resolved after placement 3 hours. Pain is worse with pressing on the area. Pain was onset 45 minutes after taking tramadol and Robaxin which she takes for chronic low back pain which was prescribed to her earlier today by her primary care physician Dr. August Saucerean. On exam alert Glasgow Coma Score 15 no distress lungs clear auscultation heart regular rate and rhythm abdomen nondistended nontender chest is tender anteriorly, reproducing pain exactly. She has pain at her lower back when she sits up from a supine position. Heart score equals 2 based on EKG and risk factors. Plan follow-up with Dr. August Saucerean as outpatient. I counseled patient for 5 minutes on smoking cessation.   Doug SouSam Mansoor Hillyard, MD 12/01/14 2017

## 2014-12-01 NOTE — Discharge Instructions (Signed)
Continue medications for back pain as prescribed by a doctor. Follow-up with him as soon as able for this chest pain. Call tomorrow. Stop smoking. Return if worsening symptoms.  Chest Pain (Nonspecific) It is often hard to give a specific diagnosis for the cause of chest pain. There is always a chance that your pain could be related to something serious, such as a heart attack or a blood clot in the lungs. You need to follow up with your health care provider for further evaluation. CAUSES   Heartburn.  Pneumonia or bronchitis.  Anxiety or stress.  Inflammation around your heart (pericarditis) or lung (pleuritis or pleurisy).  A blood clot in the lung.  A collapsed lung (pneumothorax). It can develop suddenly on its own (spontaneous pneumothorax) or from trauma to the chest.  Shingles infection (herpes zoster virus). The chest wall is composed of bones, muscles, and cartilage. Any of these can be the source of the pain.  The bones can be bruised by injury.  The muscles or cartilage can be strained by coughing or overwork.  The cartilage can be affected by inflammation and become sore (costochondritis). DIAGNOSIS  Lab tests or other studies may be needed to find the cause of your pain. Your health care provider may have you take a test called an ambulatory electrocardiogram (ECG). An ECG records your heartbeat patterns over a 24-hour period. You may also have other tests, such as:  Transthoracic echocardiogram (TTE). During echocardiography, sound waves are used to evaluate how blood flows through your heart.  Transesophageal echocardiogram (TEE).  Cardiac monitoring. This allows your health care provider to monitor your heart rate and rhythm in real time.  Holter monitor. This is a portable device that records your heartbeat and can help diagnose heart arrhythmias. It allows your health care provider to track your heart activity for several days, if needed.  Stress tests by  exercise or by giving medicine that makes the heart beat faster. TREATMENT   Treatment depends on what may be causing your chest pain. Treatment may include:  Acid blockers for heartburn.  Anti-inflammatory medicine.  Pain medicine for inflammatory conditions.  Antibiotics if an infection is present.  You may be advised to change lifestyle habits. This includes stopping smoking and avoiding alcohol, caffeine, and chocolate.  You may be advised to keep your head raised (elevated) when sleeping. This reduces the chance of acid going backward from your stomach into your esophagus. Most of the time, nonspecific chest pain will improve within 2-3 days with rest and mild pain medicine.  HOME CARE INSTRUCTIONS   If antibiotics were prescribed, take them as directed. Finish them even if you start to feel better.  For the next few days, avoid physical activities that bring on chest pain. Continue physical activities as directed.  Do not use any tobacco products, including cigarettes, chewing tobacco, or electronic cigarettes.  Avoid drinking alcohol.  Only take medicine as directed by your health care provider.  Follow your health care provider's suggestions for further testing if your chest pain does not go away.  Keep any follow-up appointments you made. If you do not go to an appointment, you could develop lasting (chronic) problems with pain. If there is any problem keeping an appointment, call to reschedule. SEEK MEDICAL CARE IF:   Your chest pain does not go away, even after treatment.  You have a rash with blisters on your chest.  You have a fever. SEEK IMMEDIATE MEDICAL CARE IF:   You have  increased chest pain or pain that spreads to your arm, neck, jaw, back, or abdomen.  You have shortness of breath.  You have an increasing cough, or you cough up blood.  You have severe back or abdominal pain.  You feel nauseous or vomit.  You have severe weakness.  You  faint.  You have chills. This is an emergency. Do not wait to see if the pain will go away. Get medical help at once. Call your local emergency services (911 in [place] Do not drive yourself to the hospital. MAKE SURE YOU:   Understand these instructions.  Will watch your condition.  Will get help right away if you are not doing well or get worse. Document Released: 04/19/2005 Document Revised: 07/15/2013 Document Reviewed: 02/13/2008 [facility] Patient Information 2015 [place], [place]. This information is not intended to replace advice given to you by your health care provider. Make sure you discuss any questions you have with your health care provider.

## 2014-12-01 NOTE — ED Notes (Signed)
Pt reports that she has had back pain for about a week. Reports she went to her PCP office and given tramadol. States she took one about an hour ago and started having chest pain. Pt reports some SOB.
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
## 2015-01-11 ENCOUNTER — Encounter (HOSPITAL_COMMUNITY): Payer: Self-pay

## 2015-01-11 ENCOUNTER — Emergency Department (HOSPITAL_COMMUNITY)
Admission: EM | Admit: 2015-01-11 | Discharge: 2015-01-11 | Disposition: A | Discharge status: Home / Self Care | Payer: Medicare Other | Attending: Emergency Medicine | Admitting: Emergency Medicine

## 2015-01-11 DIAGNOSIS — Z720 Tobacco use: Secondary | ICD-10-CM | POA: Insufficient documentation

## 2015-01-11 DIAGNOSIS — E119 Type 2 diabetes mellitus without complications: Secondary | ICD-10-CM | POA: Insufficient documentation

## 2015-01-11 DIAGNOSIS — K0889 Other specified disorders of teeth and supporting structures: Secondary | ICD-10-CM

## 2015-01-11 DIAGNOSIS — K029 Dental caries, unspecified: Secondary | ICD-10-CM | POA: Insufficient documentation

## 2015-01-11 DIAGNOSIS — K088 Other specified disorders of teeth and supporting structures: Secondary | ICD-10-CM | POA: Insufficient documentation

## 2015-01-11 MED ORDER — PENICILLIN V POTASSIUM 250 MG PO TABS: 500.0000 mg | ORAL_TABLET | Freq: Three times a day (TID) | ORAL | Status: AC

## 2015-01-11 MED ORDER — NAPROXEN 500 MG PO TABS: 500.0000 mg | ORAL_TABLET | Freq: Two times a day (BID) | ORAL | Status: DC

## 2015-01-11 NOTE — Discharge Instructions (Signed)
Naprosyn for pain. Take penicillin for possible infection. Please follow up with a dentist for further treatment.    Dental Pain A tooth ache may be caused by cavities (tooth decay). Cavities expose the nerve of the tooth to air and hot or cold temperatures. It may come from an infection or abscess (also called a boil or furuncle) around your tooth. It is also often caused by dental caries (tooth decay). This causes the pain you are having. DIAGNOSIS  Your caregiver can diagnose this problem by exam. TREATMENT   If caused by an infection, it may be treated with medications which kill germs (antibiotics) and pain medications as prescribed by your caregiver. Take medications as directed.  Only take over-the-counter or prescription medicines for pain, discomfort, or fever as directed by your caregiver.  Whether the tooth ache today is caused by infection or dental disease, you should see your dentist as soon as possible for further care. SEEK MEDICAL CARE IF: The exam and treatment you received today has been provided on an emergency basis only. This is not a substitute for complete medical or dental care. If your problem worsens or new problems (symptoms) appear, and you are unable to meet with your dentist, call or return to this location. SEEK IMMEDIATE MEDICAL CARE IF:   You have a fever.  You develop redness and swelling of your face, jaw, or neck.  You are unable to open your mouth.  You have severe pain uncontrolled by pain medicine. MAKE SURE YOU:   Understand these instructions.  Will watch your condition.  Will get help right away if you are not doing well or get worse. Document Released: 07/10/2005 Document Revised: 10/02/2011 Document Reviewed: 02/26/2008 [facility] Patient Information 2015 [place], [place]. This information is not intended to replace advice given to you by your health care provider. Make sure you discuss any questions you have with your health care  provider.
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
## 2015-01-11 NOTE — ED Notes (Signed)
Pt c/o L side upper and lower dental pain x 2 days.  Pain score 9/10.  Pt reports taking 3 Tylenol around 1030.  Sts "I woke up w/ it throbbing last night."

## 2015-01-15 ENCOUNTER — Emergency Department (HOSPITAL_COMMUNITY)
Admission: EM | Admit: 2015-01-15 | Discharge: 2015-01-15 | Disposition: A | Discharge status: Home / Self Care | Payer: Medicare Other | Attending: Emergency Medicine | Admitting: Emergency Medicine

## 2015-01-15 ENCOUNTER — Emergency Department (HOSPITAL_COMMUNITY): Payer: Medicare Other

## 2015-01-15 ENCOUNTER — Encounter (HOSPITAL_COMMUNITY): Payer: Self-pay

## 2015-01-15 DIAGNOSIS — F419 Anxiety disorder, unspecified: Secondary | ICD-10-CM | POA: Insufficient documentation

## 2015-01-15 DIAGNOSIS — R0602 Shortness of breath: Secondary | ICD-10-CM | POA: Diagnosis not present

## 2015-01-15 DIAGNOSIS — M940 Chondrocostal junction syndrome [Tietze]: Secondary | ICD-10-CM

## 2015-01-15 DIAGNOSIS — R0789 Other chest pain: Secondary | ICD-10-CM | POA: Insufficient documentation

## 2015-01-15 DIAGNOSIS — Z791 Long term (current) use of non-steroidal anti-inflammatories (NSAID): Secondary | ICD-10-CM | POA: Diagnosis not present

## 2015-01-15 DIAGNOSIS — M549 Dorsalgia, unspecified: Secondary | ICD-10-CM | POA: Diagnosis not present

## 2015-01-15 DIAGNOSIS — R21 Rash and other nonspecific skin eruption: Secondary | ICD-10-CM | POA: Diagnosis not present

## 2015-01-15 DIAGNOSIS — E119 Type 2 diabetes mellitus without complications: Secondary | ICD-10-CM | POA: Insufficient documentation

## 2015-01-15 DIAGNOSIS — Z87828 Personal history of other (healed) physical injury and trauma: Secondary | ICD-10-CM | POA: Diagnosis not present

## 2015-01-15 DIAGNOSIS — Z720 Tobacco use: Secondary | ICD-10-CM | POA: Insufficient documentation

## 2015-01-15 DIAGNOSIS — R079 Chest pain, unspecified: Secondary | ICD-10-CM | POA: Diagnosis present

## 2015-01-15 LAB — I-STAT TROPONIN, ED: Troponin i, poc: 0.01 ng/mL (ref 0.00–0.08)

## 2015-01-15 LAB — BASIC METABOLIC PANEL
Anion gap: 7 (ref 5–15)
BUN: 8 mg/dL (ref 6–20)
CO2: 26 mmol/L (ref 22–32)
Calcium: 9.3 mg/dL (ref 8.9–10.3)
Chloride: 105 mmol/L (ref 101–111)
Creatinine, Ser: 0.68 mg/dL (ref 0.44–1.00)
GFR calc Af Amer: >60 mL/min (ref >60)
GLUCOSE: 79 mg/dL (ref 65–99)
POTASSIUM: 4.9 mmol/L (ref 3.5–5.1)
SODIUM: 138 mmol/L (ref 135–145)

## 2015-01-15 LAB — CBC
HEMATOCRIT: 38.1 % (ref 36.0–46.0)
Hemoglobin: 12.9 g/dL (ref 12.0–15.0)
MCH: 30.6 pg (ref 26.0–34.0)
MCHC: 33.9 g/dL (ref 30.0–36.0)
MCV: 90.3 fL (ref 78.0–100.0)
Platelets: 248 10*3/uL (ref 150–400)
RBC: 4.22 MIL/uL (ref 3.87–5.11)
RDW: 13.1 % (ref 11.5–15.5)
WBC: 7.8 10*3/uL (ref 4.0–10.5)

## 2015-01-15 MED ORDER — LORAZEPAM 2 MG/ML IJ SOLN
1.0000 mg | Freq: Once | INTRAMUSCULAR | Status: AC
  Administered 2015-01-15: 1 mg via INTRAVENOUS
  Filled 2015-01-15: qty 1

## 2015-01-15 MED ORDER — LORAZEPAM 1 MG PO TABS: 1.0000 mg | ORAL_TABLET | Freq: Once | ORAL | Status: DC
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
## 2015-01-15 NOTE — Discharge Instructions (Signed)
Rest, avoid heavy lifting or hard physical activity. You may take ibuprofen or naproxen for your pain.  Chest Wall Pain Chest wall pain is pain in or around the bones and muscles of your chest. It may take up to 6 weeks to get better. It may take longer if you must stay physically active in your work and activities.  CAUSES  Chest wall pain may happen on its own. However, it may be caused by:  A viral illness like the flu.  Injury.  Coughing.  Exercise.  Arthritis.  Fibromyalgia.  Shingles. HOME CARE INSTRUCTIONS   Avoid overtiring physical activity. Try not to strain or perform activities that cause pain. This includes any activities using your chest or your abdominal and side muscles, especially if heavy weights are used.  Put ice on the sore area.  Put ice in a plastic bag.  Place a towel between your skin and the bag.  Leave the ice on for 15-20 minutes per hour while awake for the first 2 days.  Only take over-the-counter or prescription medicines for pain, discomfort, or fever as directed by your caregiver. SEEK IMMEDIATE MEDICAL CARE IF:   Your pain increases, or you are very uncomfortable.  You have a fever.  Your chest pain becomes worse.  You have new, unexplained symptoms.  You have nausea or vomiting.  You feel sweaty or lightheaded.  You have a cough with phlegm (sputum), or you cough up blood. MAKE SURE YOU:   Understand these instructions.  Will watch your condition.  Will get help right away if you are not doing well or get worse. Document Released: 07/10/2005 Document Revised: 10/02/2011 Document Reviewed: 03/06/2011 [facility] Patient Information 2015 [place], [place]. This information is not intended to replace advice given to you by your health care provider. Make sure you discuss any questions you have with your health care provider.  Costochondritis Costochondritis, sometimes called Tietze syndrome, is a swelling and irritation  (inflammation) of the tissue (cartilage) that connects your ribs with your breastbone (sternum). It causes pain in the chest and rib area. Costochondritis usually goes away on its own over time. It can take up to 6 weeks or longer to get better, especially if you are unable to limit your activities. CAUSES  Some cases of costochondritis have no known cause. Possible causes include:  Injury (trauma).  Exercise or activity such as lifting.  Severe coughing. SIGNS AND SYMPTOMS  Pain and tenderness in the chest and rib area.  Pain that gets worse when coughing or taking deep breaths.  Pain that gets worse with specific movements. DIAGNOSIS  Your health care provider will do a physical exam and ask about your symptoms. Chest X-rays or other tests may be done to rule out other problems. TREATMENT  Costochondritis usually goes away on its own over time. Your health care provider may prescribe medicine to help relieve pain. HOME CARE INSTRUCTIONS   Avoid exhausting physical activity. Try not to strain your ribs during normal activity. This would include any activities using chest, abdominal, and side muscles, especially if heavy weights are used.  Apply ice to the affected area for the first 2 days after the pain begins.  Put ice in a plastic bag.  Place a towel between your skin and the bag.  Leave the ice on for 20 minutes, 2-3 times a day.  Only take over-the-counter or prescription medicines as directed by your health care provider. SEEK MEDICAL CARE IF:  You have redness or swelling  at the rib joints. These are signs of infection.  Your pain does not go away despite rest or medicine. SEEK IMMEDIATE MEDICAL CARE IF:   Your pain increases or you are very uncomfortable.  You have shortness of breath or difficulty breathing.  You cough up blood.  You have worse chest pains, sweating, or vomiting.  You have a fever or persistent symptoms for more than 2-3 days.  You have a  fever and your symptoms suddenly get worse. MAKE SURE YOU:   Understand these instructions.  Will watch your condition.  Will get help right away if you are not doing well or get worse. Document Released: 04/19/2005 Document Revised: 04/30/2013 Document Reviewed: 02/11/2013 [facility] Patient Information 2015 [place], [place]. This information is not intended to replace advice given to you by your health care provider. Make sure you discuss any questions you have with your health care provider.

## 2015-01-15 NOTE — ED Notes (Signed)
Pt. Presents with complaint of R sided CP starting last PM with SOB. No radiation. Tenderness to R side of chest. Worsened on palpation.

## 2015-01-19 ENCOUNTER — Ambulatory Visit (INDEPENDENT_AMBULATORY_CARE_PROVIDER_SITE_OTHER): Payer: Medicare Other | Admitting: Internal Medicine

## 2015-01-19 ENCOUNTER — Encounter: Payer: Self-pay | Admitting: Internal Medicine

## 2015-01-19 VITALS — BP 122/82 | HR 81 | Ht 62.0 in | Wt 147.8 lb

## 2015-01-19 DIAGNOSIS — R0600 Dyspnea, unspecified: Secondary | ICD-10-CM | POA: Diagnosis not present

## 2015-01-19 NOTE — Assessment & Plan Note (Addendum)
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    Please see instructions for details which were reviewed in writing and the patient given a copy highlighting the part that I personally wrote and discussed at today's ov.

## 2015-01-19 NOTE — Patient Instructions (Signed)
Please see patient coordinator before you leave today  to schedule CTa chest today   The key is to stop smoking completely before smoking completely stops you - it's not too late
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
## 2015-01-20 ENCOUNTER — Other Ambulatory Visit: Payer: Self-pay | Admitting: Internal Medicine

## 2015-01-20 ENCOUNTER — Ambulatory Visit (INDEPENDENT_AMBULATORY_CARE_PROVIDER_SITE_OTHER)
Admission: RE | Admit: 2015-01-20 | Discharge: 2015-01-20 | Disposition: A | Discharge status: Home / Self Care | Payer: Medicare Other | Source: Ambulatory Visit | Attending: Internal Medicine | Admitting: Internal Medicine

## 2015-01-20 ENCOUNTER — Telehealth: Payer: Self-pay | Admitting: Internal Medicine

## 2015-01-20 DIAGNOSIS — R0600 Dyspnea, unspecified: Secondary | ICD-10-CM

## 2015-01-20 MED ORDER — IOHEXOL 350 MG/ML SOLN
80.0000 mL | Freq: Once | INTRAVENOUS | Status: AC | PRN
  Administered 2015-01-20: 80 mL via INTRAVENOUS

## 2015-01-20 NOTE — Telephone Encounter (Signed)
Joanne StanleyStacey with CT calling with call report for STAT CT placed 01/19/15 Neg for PE, Neg for Edema, No Adenopathy - some mild scarring with small bullae in each apex noted Please advise Dr Sherene SiresWert. Patient has gone home.

## 2015-01-20 NOTE — Progress Notes (Signed)
Quick Note:  Spoke with pt and notified of results per Dr. Wert. Pt verbalized understanding and denied any questions.  ______ 

## 2015-01-20 NOTE — Telephone Encounter (Signed)
Aware- see result note  

## 2015-02-15 ENCOUNTER — Encounter (HOSPITAL_COMMUNITY): Payer: Self-pay | Admitting: Emergency Medicine

## 2015-02-15 ENCOUNTER — Emergency Department (INDEPENDENT_AMBULATORY_CARE_PROVIDER_SITE_OTHER)
Admission: EM | Admit: 2015-02-15 | Discharge: 2015-02-15 | Disposition: A | Discharge status: Home / Self Care | Payer: Medicare Other | Source: Home / Self Care | Attending: Emergency Medicine | Admitting: Emergency Medicine

## 2015-02-15 DIAGNOSIS — K0889 Other specified disorders of teeth and supporting structures: Secondary | ICD-10-CM

## 2015-02-15 DIAGNOSIS — K088 Other specified disorders of teeth and supporting structures: Secondary | ICD-10-CM

## 2015-02-15 MED ORDER — AMOXICILLIN 500 MG PO CAPS: 1000.0000 mg | ORAL_CAPSULE | Freq: Two times a day (BID) | ORAL | Status: DC

## 2015-02-15 MED ORDER — DICLOFENAC POTASSIUM 50 MG PO TABS: 50.0000 mg | ORAL_TABLET | Freq: Three times a day (TID) | ORAL | Status: DC

## 2015-02-15 NOTE — ED Notes (Signed)
C/o "severe" tooth pain, right upper tooth and right lower tooth pain.  Onset today of pain.  Patient also complains of headache
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
## 2015-02-15 NOTE — Discharge Instructions (Signed)
Dental Pain °A tooth ache may be caused by cavities (tooth decay). Cavities expose the nerve of the tooth to air and hot or cold temperatures. It may come from an infection or abscess (also called a boil or furuncle) around your tooth. It is also often caused by dental caries (tooth decay). This causes the pain you are having. °DIAGNOSIS  °Your caregiver can diagnose this problem by exam. °TREATMENT  °· If caused by an infection, it may be treated with medications which kill germs (antibiotics) and pain medications as prescribed by your caregiver. Take medications as directed. °· Only take over-the-counter or prescription medicines for pain, discomfort, or fever as directed by your caregiver. °· Whether the tooth ache today is caused by infection or dental disease, you should see your dentist as soon as possible for further care. °SEEK MEDICAL CARE IF: °The exam and treatment you received today has been provided on an emergency basis only. This is not a substitute for complete medical or dental care. If your problem worsens or new problems (symptoms) appear, and you are unable to meet with your dentist, call or return to this location. °SEEK IMMEDIATE MEDICAL CARE IF:  °· You have a fever. °· You develop redness and swelling of your face, jaw, or neck. °· You are unable to open your mouth. °· You have severe pain uncontrolled by pain medicine. °MAKE SURE YOU:  °· Understand these instructions. °· Will watch your condition. °· Will get help right away if you are not doing well or get worse. °Document Released: 07/10/2005 Document Revised: 10/02/2011 Document Reviewed: 02/26/2008 °ExitCare® Patient Information ©2015 [employer]. This information is not intended to replace advice given to you by your health care provider. Make sure you discuss any questions you have with your health care provider. ° °Dental Care and Dentist Visits °Dental care supports good overall health. Regular dental visits can also help you  avoid dental pain, bleeding, infection, and other more serious health problems in the future. It is important to keep the mouth healthy because diseases in the teeth, gums, and other oral tissues can spread to other areas of the body. Some problems, such as diabetes, heart disease, and pre-term labor have been associated with poor oral health.  °See your dentist every 6 months. If you experience emergency problems such as a toothache or broken tooth, go to the dentist right away. If you see your dentist regularly, you may catch problems early. It is easier to be treated for problems in the early stages.  °WHAT TO EXPECT AT A DENTIST VISIT  °Your dentist will look for many common oral health problems and recommend proper treatment. At your regular dental visit, you can expect: °· Gentle cleaning of the teeth and gums. This includes scraping and polishing. This helps to remove the sticky substance around the teeth and gums (plaque). Plaque forms in the mouth shortly after eating. Over time, plaque hardens on the teeth as tartar. If tartar is not removed regularly, it can cause problems. Cleaning also helps remove stains. °· Periodic X-rays. These pictures of the teeth and supporting bone will help your dentist assess the health of your teeth. °· Periodic fluoride treatments. Fluoride is a natural mineral shown to help strengthen teeth. Fluoride treatment involves applying a fluoride gel or varnish to the teeth. It is most commonly done in children. °· Examination of the mouth, tongue, jaws, teeth, and gums to look for any oral health problems, such as: °¨ Cavities (dental caries). This is   decay on the tooth caused by plaque, sugar, and acid in the mouth. It is best to catch a cavity when it is small. °¨ Inflammation of the gums caused by plaque buildup (gingivitis). °¨ Problems with the mouth or malformed or misaligned teeth. °¨ Oral cancer or other diseases of the soft tissues or jaws.  °KEEP YOUR TEETH AND GUMS  HEALTHY °For healthy teeth and gums, follow these general guidelines as well as your dentist's specific advice: °· Have your teeth professionally cleaned at the dentist every 6 months. °· Brush twice daily with a fluoride toothpaste. °· Floss your teeth daily.  °· Ask your dentist if you need fluoride supplements, treatments, or fluoride toothpaste. °· Eat a healthy diet. Reduce foods and drinks with added sugar. °· Avoid smoking. °TREATMENT FOR ORAL HEALTH PROBLEMS °If you have oral health problems, treatment varies depending on the conditions present in your teeth and gums. °· Your caregiver will most likely recommend good oral hygiene at each visit. °· For cavities, gingivitis, or other oral health disease, your caregiver will perform a procedure to treat the problem. This is typically done at a separate appointment. Sometimes your caregiver will refer you to another dental specialist for specific tooth problems or for surgery. °SEEK IMMEDIATE DENTAL CARE IF: °· You have pain, bleeding, or soreness in the gum, tooth, jaw, or mouth area. °· A permanent tooth becomes loose or separated from the gum socket. °· You experience a blow or injury to the mouth or jaw area. °Document Released: 03/22/2011 Document Revised: 10/02/2011 Document Reviewed: 03/22/2011 °ExitCare® Patient Information ©2015 [employer]. This information is not intended to replace advice given to you by your health care provider. Make sure you discuss any questions you have with your health care provider. ° °

## 2015-02-26 ENCOUNTER — Ambulatory Visit (HOSPITAL_COMMUNITY)
Admission: RE | Admit: 2015-02-26 | Discharge: 2015-02-26 | Disposition: A | Discharge status: Home / Self Care | Payer: Medicare Other | Source: Ambulatory Visit | Attending: Internal Medicine | Admitting: Internal Medicine

## 2015-02-26 DIAGNOSIS — R0602 Shortness of breath: Secondary | ICD-10-CM | POA: Diagnosis not present

## 2015-02-26 DIAGNOSIS — F17210 Nicotine dependence, cigarettes, uncomplicated: Secondary | ICD-10-CM | POA: Insufficient documentation

## 2015-02-26 DIAGNOSIS — R0600 Dyspnea, unspecified: Secondary | ICD-10-CM

## 2015-02-26 LAB — PULMONARY FUNCTION TEST
DL/VA % PRED: 107 %
DL/VA: 4.89 ml/min/mmHg/L
DLCO UNC % PRED: 67 %
DLCO unc: 14.64 ml/min/mmHg
FEF 25-75 POST: 1.91 L/s
FEF 25-75 Pre: 1.58 L/sec
FEF2575-%Change-Post: 20 %
FEF2575-%Pred-Post: 70 %
FEF2575-%Pred-Pre: 58 %
FEV1-%Change-Post: 1 %
FEV1-%PRED-PRE: 76 %
FEV1-%Pred-Post: 77 %
FEV1-POST: 1.83 L
FEV1-Pre: 1.8 L
FEV1FVC-%Change-Post: 8 %
FEV1FVC-%Pred-Pre: 93 %
FEV6-%Change-Post: -7 %
FEV6-%Pred-Post: 76 %
FEV6-%Pred-Pre: 82 %
FEV6-POST: 2.13 L
FEV6-PRE: 2.31 L
FEV6FVC-%Change-Post: 0 %
FEV6FVC-%Pred-Post: 101 %
FEV6FVC-%Pred-Pre: 102 %
FVC-%CHANGE-POST: -6 %
FVC-%PRED-POST: 75 %
FVC-%Pred-Pre: 80 %
FVC-POST: 2.17 L
FVC-PRE: 2.31 L
Post FEV1/FVC ratio: 84 %
Post FEV6/FVC ratio: 100 %
Pre FEV1/FVC ratio: 78 %
Pre FEV6/FVC Ratio: 100 %
RV % pred: 68 %
RV: 1.02 L
TLC % pred: 69 %
TLC: 3.29 L

## 2015-02-26 MED ORDER — ALBUTEROL SULFATE (2.5 MG/3ML) 0.083% IN NEBU
2.5000 mg | INHALATION_SOLUTION | Freq: Once | RESPIRATORY_TRACT | Status: AC
  Administered 2015-02-26: 2.5 mg via RESPIRATORY_TRACT

## 2015-03-01 NOTE — Progress Notes (Signed)
Quick Note:  ATC, NA and no option to leave a vm ______

## 2015-03-02 NOTE — Progress Notes (Signed)
Quick Note:  Spoke with pt and notified of results per Dr. Wert. Pt verbalized understanding and denied any questions.  ______ 

## 2015-03-09 ENCOUNTER — Ambulatory Visit: Payer: Medicare Other | Admitting: Internal Medicine

## 2015-04-21 ENCOUNTER — Ambulatory Visit (INDEPENDENT_AMBULATORY_CARE_PROVIDER_SITE_OTHER): Payer: Medicare Other | Admitting: Internal Medicine

## 2015-04-21 ENCOUNTER — Encounter: Payer: Self-pay | Admitting: Internal Medicine

## 2015-04-21 VITALS — BP 104/71 | HR 80 | Temp 98.2°F | Ht 62.0 in | Wt 158.5 lb

## 2015-04-21 DIAGNOSIS — F329 Major depressive disorder, single episode, unspecified: Secondary | ICD-10-CM

## 2015-04-21 DIAGNOSIS — M1990 Unspecified osteoarthritis, unspecified site: Secondary | ICD-10-CM | POA: Insufficient documentation

## 2015-04-21 DIAGNOSIS — M153 Secondary multiple arthritis: Secondary | ICD-10-CM

## 2015-04-21 DIAGNOSIS — Z0000 Encounter for general adult medical examination without abnormal findings: Secondary | ICD-10-CM | POA: Insufficient documentation

## 2015-04-21 DIAGNOSIS — F32A Depression, unspecified: Secondary | ICD-10-CM

## 2015-04-21 MED ORDER — DICLOFENAC SODIUM 1 % TD GEL: 2.0000 g | Freq: Four times a day (QID) | TRANSDERMAL | Status: DC

## 2015-04-21 NOTE — Assessment & Plan Note (Signed)
Discuss flu shot and pap smear on next visit.

## 2015-04-21 NOTE — Patient Instructions (Signed)
It was a pleasure to meet you Ms. Joanne Daugherty.  For your arthritis, please try spreading your tylenol out three times throughout the day, so 2 pills in the morning, 2 in around noon, and 2 at night.   I have prescribed the Voltaren gel which you may use 4-6 times per day on the areas that are bothering you. Please let us know if you have any side effects to this.  I have ordered an X-Ray for your right hand.  We will follow up in about a month.

## 2015-04-21 NOTE — Assessment & Plan Note (Signed)
Patient reports history of depression and bipolar which she follows at [facility] for management. She is currently on Paroxetine, Olanzapine, Hydroxyzine, and Trazodone. -Will defer to her behavioral health providers for management.
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
## 2015-04-23 NOTE — Progress Notes (Signed)
Internal Medicine Clinic Attending  I saw and evaluated the patient.  I personally confirmed the key portions of the history and exam documented by Dr. Patel,Vishal and I reviewed pertinent patient test results.  The assessment, diagnosis, and plan were formulated together and I agree with the documentation in the resident's note.  

## 2015-05-22 ENCOUNTER — Emergency Department (HOSPITAL_COMMUNITY)
Admission: EM | Admit: 2015-05-22 | Discharge: 2015-05-22 | Disposition: A | Discharge status: Home / Self Care | Payer: Medicare Other | Attending: Emergency Medicine | Admitting: Emergency Medicine

## 2015-05-22 ENCOUNTER — Encounter (HOSPITAL_COMMUNITY): Payer: Self-pay | Admitting: *Deleted

## 2015-05-22 DIAGNOSIS — Z79899 Other long term (current) drug therapy: Secondary | ICD-10-CM | POA: Insufficient documentation

## 2015-05-22 DIAGNOSIS — F329 Major depressive disorder, single episode, unspecified: Secondary | ICD-10-CM | POA: Insufficient documentation

## 2015-05-22 DIAGNOSIS — M1990 Unspecified osteoarthritis, unspecified site: Secondary | ICD-10-CM | POA: Insufficient documentation

## 2015-05-22 DIAGNOSIS — M545 Low back pain, unspecified: Secondary | ICD-10-CM

## 2015-05-22 DIAGNOSIS — Z791 Long term (current) use of non-steroidal anti-inflammatories (NSAID): Secondary | ICD-10-CM | POA: Insufficient documentation

## 2015-05-22 DIAGNOSIS — G8929 Other chronic pain: Secondary | ICD-10-CM | POA: Diagnosis not present

## 2015-05-22 DIAGNOSIS — Z720 Tobacco use: Secondary | ICD-10-CM | POA: Diagnosis not present

## 2015-05-22 DIAGNOSIS — M5431 Sciatica, right side: Secondary | ICD-10-CM | POA: Diagnosis not present

## 2015-05-22 MED ORDER — CYCLOBENZAPRINE HCL 10 MG PO TABS: 10.0000 mg | ORAL_TABLET | Freq: Two times a day (BID) | ORAL | Status: AC | PRN

## 2015-05-22 MED ORDER — OXYCODONE HCL 5 MG PO TABS: 5.0000 mg | ORAL_TABLET | ORAL | Status: AC | PRN

## 2015-05-22 MED ORDER — OXYCODONE-ACETAMINOPHEN 5-325 MG PO TABS
1.0000 | ORAL_TABLET | Freq: Once | ORAL | Status: AC
  Administered 2015-05-22: 1 via ORAL
  Filled 2015-05-22: qty 1

## 2015-05-22 NOTE — Discharge Instructions (Signed)
We are giving you enough medication to last until you can contact your doctor on Monday for any further pain management. Continue to take the medication you have for inflammation. Do not drive while taking the pain medication or the muscle relaxant because they will make you sleepy.

## 2015-05-22 NOTE — ED Notes (Signed)
Declined W/C at D/C and was escorted to lobby by RN. 

## 2015-05-22 NOTE — ED Notes (Signed)
Pt reports lower back pain with pain that radiates into RT buttocks and Rt hip. Pt reports a burning pain.
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
## 2015-09-08 ENCOUNTER — Encounter (HOSPITAL_COMMUNITY): Payer: Self-pay | Admitting: Emergency Medicine

## 2015-09-08 ENCOUNTER — Emergency Department (HOSPITAL_COMMUNITY)
Admission: EM | Admit: 2015-09-08 | Discharge: 2015-09-08 | Disposition: A | Discharge status: Home / Self Care | Payer: Medicare Other | Attending: Emergency Medicine | Admitting: Emergency Medicine

## 2015-09-08 ENCOUNTER — Emergency Department (HOSPITAL_COMMUNITY): Payer: Medicare Other

## 2015-09-08 DIAGNOSIS — R519 Headache, unspecified: Secondary | ICD-10-CM

## 2015-09-08 DIAGNOSIS — M1990 Unspecified osteoarthritis, unspecified site: Secondary | ICD-10-CM | POA: Insufficient documentation

## 2015-09-08 DIAGNOSIS — F17210 Nicotine dependence, cigarettes, uncomplicated: Secondary | ICD-10-CM | POA: Insufficient documentation

## 2015-09-08 DIAGNOSIS — M25561 Pain in right knee: Secondary | ICD-10-CM | POA: Diagnosis not present

## 2015-09-08 DIAGNOSIS — F329 Major depressive disorder, single episode, unspecified: Secondary | ICD-10-CM | POA: Insufficient documentation

## 2015-09-08 DIAGNOSIS — G8929 Other chronic pain: Secondary | ICD-10-CM | POA: Insufficient documentation

## 2015-09-08 DIAGNOSIS — Z791 Long term (current) use of non-steroidal anti-inflammatories (NSAID): Secondary | ICD-10-CM | POA: Insufficient documentation

## 2015-09-08 DIAGNOSIS — Z79899 Other long term (current) drug therapy: Secondary | ICD-10-CM | POA: Diagnosis not present

## 2015-09-08 DIAGNOSIS — R51 Headache: Secondary | ICD-10-CM | POA: Diagnosis not present

## 2015-09-08 DIAGNOSIS — M545 Low back pain: Secondary | ICD-10-CM | POA: Diagnosis not present

## 2015-09-08 HISTORY — DX: Dorsalgia, unspecified: M54.9

## 2015-09-08 HISTORY — DX: Other chronic pain: G89.29

## 2015-09-08 MED ORDER — METOCLOPRAMIDE HCL 5 MG/ML IJ SOLN
10.0000 mg | Freq: Once | INTRAMUSCULAR | Status: AC
  Administered 2015-09-08: 10 mg via INTRAVENOUS
  Filled 2015-09-08: qty 2

## 2015-09-08 MED ORDER — DIPHENHYDRAMINE HCL 50 MG/ML IJ SOLN
25.0000 mg | Freq: Once | INTRAMUSCULAR | Status: AC
  Administered 2015-09-08: 25 mg via INTRAVENOUS
  Filled 2015-09-08: qty 1

## 2015-09-08 MED ORDER — ACETAMINOPHEN 500 MG PO TABS
1000.0000 mg | ORAL_TABLET | Freq: Once | ORAL | Status: AC
  Administered 2015-09-08: 1000 mg via ORAL
  Filled 2015-09-08: qty 2

## 2015-09-08 MED ORDER — LORAZEPAM 2 MG/ML IJ SOLN
1.0000 mg | Freq: Once | INTRAMUSCULAR | Status: AC
  Administered 2015-09-08: 1 mg via INTRAVENOUS
  Filled 2015-09-08: qty 1

## 2015-09-08 MED ORDER — SODIUM CHLORIDE 0.9 % IV BOLUS (SEPSIS)
1000.0000 mL | Freq: Once | INTRAVENOUS | Status: AC
  Administered 2015-09-08: 1000 mL via INTRAVENOUS

## 2015-09-08 MED ORDER — DEXAMETHASONE SODIUM PHOSPHATE 10 MG/ML IJ SOLN
10.0000 mg | Freq: Once | INTRAMUSCULAR | Status: AC
  Administered 2015-09-08: 10 mg via INTRAVENOUS
  Filled 2015-09-08: qty 1

## 2015-09-08 NOTE — ED Notes (Signed)
MD at bedside. Pt tearful.

## 2015-09-08 NOTE — ED Notes (Signed)
Pt ambulates independently and with steady gait at time of discharge. Discharge instructions and follow up information reviewed with patient. No other questions or concerns voiced at this time.  

## 2015-09-08 NOTE — Discharge Instructions (Signed)
Continue your Percocet as previously prescribed.  Follow up with your primary Dr. if not improving or if symptoms worsen.   General Headache Without Cause A headache is pain or discomfort felt around the head or neck area. The specific cause of a headache may not be found. There are many causes and types of headaches. A few common ones are:  Tension headaches.  Migraine headaches.  Cluster headaches.  Chronic daily headaches. HOME CARE INSTRUCTIONS  Watch your condition for any changes. Take these steps to help with your condition: Managing Pain  Take over-the-counter and prescription medicines only as told by your health care provider.  Lie down in a dark, quiet room when you have a headache.  If directed, apply ice to the head and neck area:  Put ice in a plastic bag.  Place a towel between your skin and the bag.  Leave the ice on for 20 minutes, 2-3 times per day.  Use a heating pad or hot shower to apply heat to the head and neck area as told by your health care provider.  Keep lights dim if bright lights bother you or make your headaches worse. Eating and Drinking  Eat meals on a regular schedule.  Limit alcohol use.  Decrease the amount of caffeine you drink, or stop drinking caffeine. General Instructions  Keep all follow-up visits as told by your health care provider. This is important.  Keep a headache journal to help find out what may trigger your headaches. For example, write down:  What you eat and drink.  How much sleep you get.  Any change to your diet or medicines.  Try massage or other relaxation techniques.  Limit stress.  Sit up straight, and do not tense your muscles.  Do not use tobacco products, including cigarettes, chewing tobacco, or e-cigarettes. If you need help quitting, ask your health care provider.  Exercise regularly as told by your health care provider.  Sleep on a regular schedule. Get 7-9 hours of sleep, or the amount  recommended by your health care provider. SEEK MEDICAL CARE IF:   Your symptoms are not helped by medicine.  You have a headache that is different from the usual headache.  You have nausea or you vomit.  You have a fever. SEEK IMMEDIATE MEDICAL CARE IF:   Your headache becomes severe.  You have repeated vomiting.  You have a stiff neck.  You have a loss of vision.  You have problems with speech.  You have pain in the eye or ear.  You have muscular weakness or loss of muscle control.  You lose your balance or have trouble walking.  You feel faint or pass out.  You have confusion.   This information is not intended to replace advice given to you by your health care provider. Make sure you discuss any questions you have with your health care provider.   Document Released: 07/10/2005 Document Revised: 03/31/2015 Document Reviewed: 11/02/2014 Elsevier Interactive Patient Education [employer]

## 2015-09-08 NOTE — ED Notes (Signed)
Patient states headache that started last night.   Patient states took naproxyn this morning with no relief.   Patient states R knee pain and swelling.   Patient denies injury.   Patient states chronic back pain also a problem today.
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
## 2015-09-30 ENCOUNTER — Ambulatory Visit: Payer: Medicare Other | Attending: Physician Assistant | Admitting: Physical Therapy

## 2015-10-17 ENCOUNTER — Emergency Department (HOSPITAL_COMMUNITY): Payer: Medicare Other

## 2015-10-17 ENCOUNTER — Emergency Department (HOSPITAL_COMMUNITY)
Admission: EM | Admit: 2015-10-17 | Discharge: 2015-10-17 | Disposition: A | Discharge status: Home / Self Care | Payer: Medicare Other | Attending: Emergency Medicine | Admitting: Emergency Medicine

## 2015-10-17 ENCOUNTER — Encounter (HOSPITAL_COMMUNITY): Payer: Self-pay | Admitting: Emergency Medicine

## 2015-10-17 DIAGNOSIS — F17210 Nicotine dependence, cigarettes, uncomplicated: Secondary | ICD-10-CM | POA: Diagnosis not present

## 2015-10-17 DIAGNOSIS — G8929 Other chronic pain: Secondary | ICD-10-CM | POA: Insufficient documentation

## 2015-10-17 DIAGNOSIS — J209 Acute bronchitis, unspecified: Secondary | ICD-10-CM | POA: Insufficient documentation

## 2015-10-17 DIAGNOSIS — F329 Major depressive disorder, single episode, unspecified: Secondary | ICD-10-CM | POA: Insufficient documentation

## 2015-10-17 DIAGNOSIS — M1990 Unspecified osteoarthritis, unspecified site: Secondary | ICD-10-CM | POA: Insufficient documentation

## 2015-10-17 DIAGNOSIS — Z791 Long term (current) use of non-steroidal anti-inflammatories (NSAID): Secondary | ICD-10-CM | POA: Insufficient documentation

## 2015-10-17 DIAGNOSIS — R05 Cough: Secondary | ICD-10-CM | POA: Diagnosis present

## 2015-10-17 DIAGNOSIS — Z79899 Other long term (current) drug therapy: Secondary | ICD-10-CM | POA: Insufficient documentation

## 2015-10-17 MED ORDER — ALBUTEROL SULFATE HFA 108 (90 BASE) MCG/ACT IN AERS
2.0000 | INHALATION_SPRAY | Freq: Once | RESPIRATORY_TRACT | Status: AC
  Administered 2015-10-17: 2 via RESPIRATORY_TRACT
  Filled 2015-10-17: qty 6.7

## 2015-10-17 MED ORDER — FLUTICASONE PROPIONATE 50 MCG/ACT NA SUSP: 2.0000 | Freq: Every day | NASAL | Status: DC

## 2015-10-17 MED ORDER — IPRATROPIUM-ALBUTEROL 0.5-2.5 (3) MG/3ML IN SOLN
3.0000 mL | Freq: Once | RESPIRATORY_TRACT | Status: AC
  Administered 2015-10-17: 3 mL via RESPIRATORY_TRACT
  Filled 2015-10-17: qty 3

## 2015-10-17 MED ORDER — NAPROXEN 250 MG PO TABS
250.0000 mg | ORAL_TABLET | Freq: Two times a day (BID) | ORAL | Status: DC
Start: 2015-10-17 — End: 2018-03-11

## 2015-10-17 MED ORDER — CETIRIZINE HCL 10 MG PO TABS: 10.0000 mg | ORAL_TABLET | Freq: Every day | ORAL | Status: DC

## 2015-10-17 MED ORDER — BENZONATATE 100 MG PO CAPS: 100.0000 mg | ORAL_CAPSULE | Freq: Three times a day (TID) | ORAL | Status: DC | PRN

## 2015-10-17 NOTE — ED Notes (Signed)
Bed: WA26 Expected date:  Expected time:  Means of arrival:  Comments: 
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
## 2015-10-17 NOTE — Discharge Instructions (Signed)
Acute Bronchitis Bronchitis is inflammation of the airways that extend from the windpipe into the lungs (bronchi). The inflammation often causes mucus to develop. This leads to a cough, which is the most common symptom of bronchitis.  In acute bronchitis, the condition usually develops suddenly and goes away over time, usually in a couple weeks. Smoking, allergies, and asthma can make bronchitis worse. Repeated episodes of bronchitis may cause further lung problems.  CAUSES Acute bronchitis is most often caused by the same virus that causes a cold. The virus can spread from person to person (contagious) through coughing, sneezing, and touching contaminated objects. SIGNS AND SYMPTOMS   Cough.   Fever.   Coughing up mucus.   Body aches.   Chest congestion.   Chills.   Shortness of breath.   Sore throat.  DIAGNOSIS  Acute bronchitis is usually diagnosed through a physical exam. Your health care provider will also ask you questions about your medical history. Tests, such as chest X-rays, are sometimes done to rule out other conditions.  TREATMENT  Acute bronchitis usually goes away in a couple weeks. Oftentimes, no medical treatment is necessary. Medicines are sometimes given for relief of fever or cough. Antibiotic medicines are usually not needed but may be prescribed in certain situations. In some cases, an inhaler may be recommended to help reduce shortness of breath and control the cough. A cool mist vaporizer may also be used to help thin bronchial secretions and make it easier to clear the chest.  HOME CARE INSTRUCTIONS  Get plenty of rest.   Drink enough fluids to keep your urine clear or pale yellow (unless you have a medical condition that requires fluid restriction). Increasing fluids may help thin your respiratory secretions (sputum) and reduce chest congestion, and it will prevent dehydration.   Take medicines only as directed by your health care provider.  If  you were prescribed an antibiotic medicine, finish it all even if you start to feel better.  Avoid smoking and secondhand smoke. Exposure to cigarette smoke or irritating chemicals will make bronchitis worse. If you are a smoker, consider using nicotine gum or skin patches to help control withdrawal symptoms. Quitting smoking will help your lungs heal faster.   Reduce the chances of another bout of acute bronchitis by washing your hands frequently, avoiding people with cold symptoms, and trying not to touch your hands to your mouth, nose, or eyes.   Keep all follow-up visits as directed by your health care provider.  SEEK MEDICAL CARE IF: Your symptoms do not improve after 1 week of treatment.  SEEK IMMEDIATE MEDICAL CARE IF:  You develop an increased fever or chills.   You have chest pain.   You have severe shortness of breath.  You have bloody sputum.   You develop dehydration.  You faint or repeatedly feel like you are going to pass out.  You develop repeated vomiting.  You develop a severe headache. MAKE SURE YOU:   Understand these instructions.  Will watch your condition.  Will get help right away if you are not doing well or get worse.   This information is not intended to replace advice given to you by your health care provider. Make sure you discuss any questions you have with your health care provider.   Document Released: 08/17/2004 Document Revised: 07/31/2014 Document Reviewed: 12/31/2012 Elsevier Interactive Patient Education 2016 [employer] Albuterol inhalation aerosol What is this medicine? ALBUTEROL (al Gaspar Bidding) is a bronchodilator. It helps open up  the airways in your lungs to make it easier to breathe. This medicine is used to treat and to prevent bronchospasm. This medicine may be used for other purposes; ask your health care provider or pharmacist if you have questions. What should I tell my health care provider before I take this  medicine? They need to know if you have any of the following conditions: -diabetes -heart disease or irregular heartbeat -high blood pressure -pheochromocytoma -seizures -thyroid disease -an unusual or allergic reaction to albuterol, levalbuterol, sulfites, other medicines, foods, dyes, or preservatives -pregnant or trying to get pregnant -breast-feeding How should I use this medicine? This medicine is for inhalation through the mouth. Follow the directions on your prescription label. Take your medicine at regular intervals. Do not use more often than directed. Make sure that you are using your inhaler correctly. Ask you doctor or health care provider if you have any questions. Talk to your pediatrician regarding the use of this medicine in children. Special care may be needed. Overdosage: If you think you have taken too much of this medicine contact a poison control center or emergency room at once. NOTE: This medicine is only for you. Do not share this medicine with others. What if I miss a dose? If you miss a dose, use it as soon as you can. If it is almost time for your next dose, use only that dose. Do not use double or extra doses. What may interact with this medicine? -anti-infectives like chloroquine and pentamidine -caffeine -cisapride -diuretics -medicines for colds -medicines for depression or for emotional or psychotic conditions -medicines for weight loss including some herbal products -methadone -some antibiotics like clarithromycin, erythromycin, levofloxacin, and linezolid -some heart medicines -steroid hormones like dexamethasone, cortisone, hydrocortisone -theophylline -thyroid hormones This list may not describe all possible interactions. Give your health care provider a list of all the medicines, herbs, non-prescription drugs, or dietary supplements you use. Also tell them if you smoke, drink alcohol, or use illegal drugs. Some items may interact with your  medicine. What should I watch for while using this medicine? Tell your doctor or health care professional if your symptoms do not improve. Do not use extra albuterol. If your asthma or bronchitis gets worse while you are using this medicine, call your doctor right away. If your mouth gets dry try chewing sugarless gum or sucking hard candy. Drink water as directed. What side effects may I notice from receiving this medicine? Side effects that you should report to your doctor or health care professional as soon as possible: -allergic reactions like skin rash, itching or hives, swelling of the face, lips, or tongue -breathing problems -chest pain -feeling faint or lightheaded, falls -high blood pressure -irregular heartbeat -fever -muscle cramps or weakness -pain, tingling, numbness in the hands or feet -vomiting Side effects that usually do not require medical attention (report to your doctor or health care professional if they continue or are bothersome): -cough -difficulty sleeping -headache -nervousness or trembling -stomach upset -stuffy or runny nose -throat irritation -unusual taste This list may not describe all possible side effects. Call your doctor for medical advice about side effects. You may report side effects to FDA at 1-800-FDA-1088. Where should I keep my medicine? Keep out of the reach of children. Store at room temperature between 15 and 30 degrees C (59 and 86 degrees F). The contents are under pressure and may burst when exposed to heat or flame. Do not freeze. This medicine does not work as well  if it is too cold. Throw away any unused medicine after the expiration date. Inhalers need to be thrown away after the labeled number of puffs have been used or by the expiration date; whichever comes first. Ventolin HFA should be thrown away 12 months after removing from foil pouch. Check the instructions that come with your medicine. NOTE: This sheet is a summary. It may  not cover all possible information. If you have questions about this medicine, talk to your doctor, pharmacist, or health care provider.    2016, Elsevier/Gold Standard. (2012-12-26 10:57:17)

## 2015-10-17 NOTE — ED Notes (Signed)
Pt has had cough x 2 days.  States no fever in last 24 hours.  Feels ShOB.

## 2015-10-29 ENCOUNTER — Emergency Department (HOSPITAL_COMMUNITY)
Admission: EM | Admit: 2015-10-29 | Discharge: 2015-10-29 | Disposition: A | Discharge status: Home / Self Care | Payer: Medicare Other | Attending: Emergency Medicine | Admitting: Emergency Medicine

## 2015-10-29 ENCOUNTER — Encounter (HOSPITAL_COMMUNITY): Payer: Self-pay

## 2015-10-29 DIAGNOSIS — Z79899 Other long term (current) drug therapy: Secondary | ICD-10-CM | POA: Diagnosis not present

## 2015-10-29 DIAGNOSIS — M545 Low back pain: Secondary | ICD-10-CM | POA: Diagnosis not present

## 2015-10-29 DIAGNOSIS — M1990 Unspecified osteoarthritis, unspecified site: Secondary | ICD-10-CM | POA: Insufficient documentation

## 2015-10-29 DIAGNOSIS — Z3202 Encounter for pregnancy test, result negative: Secondary | ICD-10-CM | POA: Diagnosis not present

## 2015-10-29 DIAGNOSIS — A084 Viral intestinal infection, unspecified: Secondary | ICD-10-CM | POA: Insufficient documentation

## 2015-10-29 DIAGNOSIS — F17210 Nicotine dependence, cigarettes, uncomplicated: Secondary | ICD-10-CM | POA: Diagnosis not present

## 2015-10-29 DIAGNOSIS — Z8709 Personal history of other diseases of the respiratory system: Secondary | ICD-10-CM | POA: Insufficient documentation

## 2015-10-29 DIAGNOSIS — Z7951 Long term (current) use of inhaled steroids: Secondary | ICD-10-CM | POA: Insufficient documentation

## 2015-10-29 DIAGNOSIS — Z87828 Personal history of other (healed) physical injury and trauma: Secondary | ICD-10-CM | POA: Diagnosis not present

## 2015-10-29 DIAGNOSIS — G8929 Other chronic pain: Secondary | ICD-10-CM | POA: Insufficient documentation

## 2015-10-29 DIAGNOSIS — R112 Nausea with vomiting, unspecified: Secondary | ICD-10-CM | POA: Diagnosis present

## 2015-10-29 DIAGNOSIS — F329 Major depressive disorder, single episode, unspecified: Secondary | ICD-10-CM | POA: Diagnosis not present

## 2015-10-29 HISTORY — DX: Bronchitis, not specified as acute or chronic: J40

## 2015-10-29 LAB — BASIC METABOLIC PANEL
ANION GAP: 9 (ref 5–15)
BUN: 8 mg/dL (ref 6–20)
CHLORIDE: 110 mmol/L (ref 101–111)
CO2: 24 mmol/L (ref 22–32)
Calcium: 9.1 mg/dL (ref 8.9–10.3)
Creatinine, Ser: 0.56 mg/dL (ref 0.44–1.00)
GFR calc Af Amer: >60 mL/min (ref >60)
GLUCOSE: 101 mg/dL — AB (ref 65–99)
POTASSIUM: 3.5 mmol/L (ref 3.5–5.1)
Sodium: 143 mmol/L (ref 135–145)

## 2015-10-29 LAB — CBC
HEMATOCRIT: 37.2 % (ref 36.0–46.0)
Hemoglobin: 12.9 g/dL (ref 12.0–15.0)
MCH: 30.9 pg (ref 26.0–34.0)
MCHC: 34.7 g/dL (ref 30.0–36.0)
MCV: 89 fL (ref 78.0–100.0)
Platelets: 156 10*3/uL (ref 150–400)
RBC: 4.18 MIL/uL (ref 3.87–5.11)
RDW: 13.8 % (ref 11.5–15.5)
WBC: 5.7 10*3/uL (ref 4.0–10.5)

## 2015-10-29 LAB — URINALYSIS, ROUTINE W REFLEX MICROSCOPIC
Bilirubin Urine: NEGATIVE
GLUCOSE, UA: NEGATIVE mg/dL
Ketones, ur: NEGATIVE mg/dL
LEUKOCYTES UA: NEGATIVE
Nitrite: NEGATIVE
PH: 5.5 (ref 5.0–8.0)
PROTEIN: NEGATIVE mg/dL
Specific Gravity, Urine: 1.031 — ABNORMAL HIGH (ref 1.005–1.030)

## 2015-10-29 LAB — POC URINE PREG, ED: Preg Test, Ur: NEGATIVE

## 2015-10-29 LAB — URINE MICROSCOPIC-ADD ON

## 2015-10-29 MED ORDER — ONDANSETRON HCL 4 MG PO TABS: 4.0000 mg | ORAL_TABLET | Freq: Three times a day (TID) | ORAL | Status: DC | PRN

## 2015-10-29 MED ORDER — ACETAMINOPHEN 500 MG PO TABS
1000.0000 mg | ORAL_TABLET | Freq: Once | ORAL | Status: AC
  Administered 2015-10-29: 1000 mg via ORAL
  Filled 2015-10-29: qty 2

## 2015-10-29 MED ORDER — SODIUM CHLORIDE 0.9 % IV BOLUS (SEPSIS)
1000.0000 mL | Freq: Once | INTRAVENOUS | Status: AC
  Administered 2015-10-29: 1000 mL via INTRAVENOUS

## 2015-10-29 MED ORDER — IBUPROFEN 200 MG PO TABS
400.0000 mg | ORAL_TABLET | Freq: Once | ORAL | Status: AC
  Administered 2015-10-29: 400 mg via ORAL
  Filled 2015-10-29: qty 2

## 2015-10-29 MED ORDER — LOPERAMIDE HCL 2 MG PO CAPS: 2.0000 mg | ORAL_CAPSULE | Freq: Four times a day (QID) | ORAL | Status: DC | PRN

## 2015-10-29 NOTE — ED Notes (Signed)
Pt is aware of need for urine specimen. 

## 2015-10-29 NOTE — ED Notes (Signed)
PT RECEIVED VIA EMS C/O N/V/D, DRY COUGH, AND LOWER BACK (SPINE) PAIN SINCE 7AM. PT STATES SHE WAS TX FOR BRONCHITIS LAST WEEK, BUT THE MEDS MADE HER SICK. DENIES FEVER.
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
## 2015-10-29 NOTE — ED Notes (Signed)
PT DISCHARGED. INSTRUCTIONS AND PRESCRIPTIONS GIVEN. AAOX3. PT IN NO APPARENT DISTRESS. THE OPPORTUNITY TO ASK QUESTIONS WAS PROVIDED. 

## 2015-10-29 NOTE — ED Notes (Signed)
Bed: [id number] Expected date:  Expected time:  Means of arrival:  Comments: [age] F N/V
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
## 2015-12-17 IMAGING — DX DG CHEST 2V
2 series · 2 of 2 positions shown · non-contrast
Comparison: CT Abdomen and Pelvis 08/08/2014.

CLINICAL DATA: 38-year-old female with chest pain and shortness of
Breath since 1188 hrs. Initial encounter.

EXAM:
CHEST  2 VIEW

[chest pa]
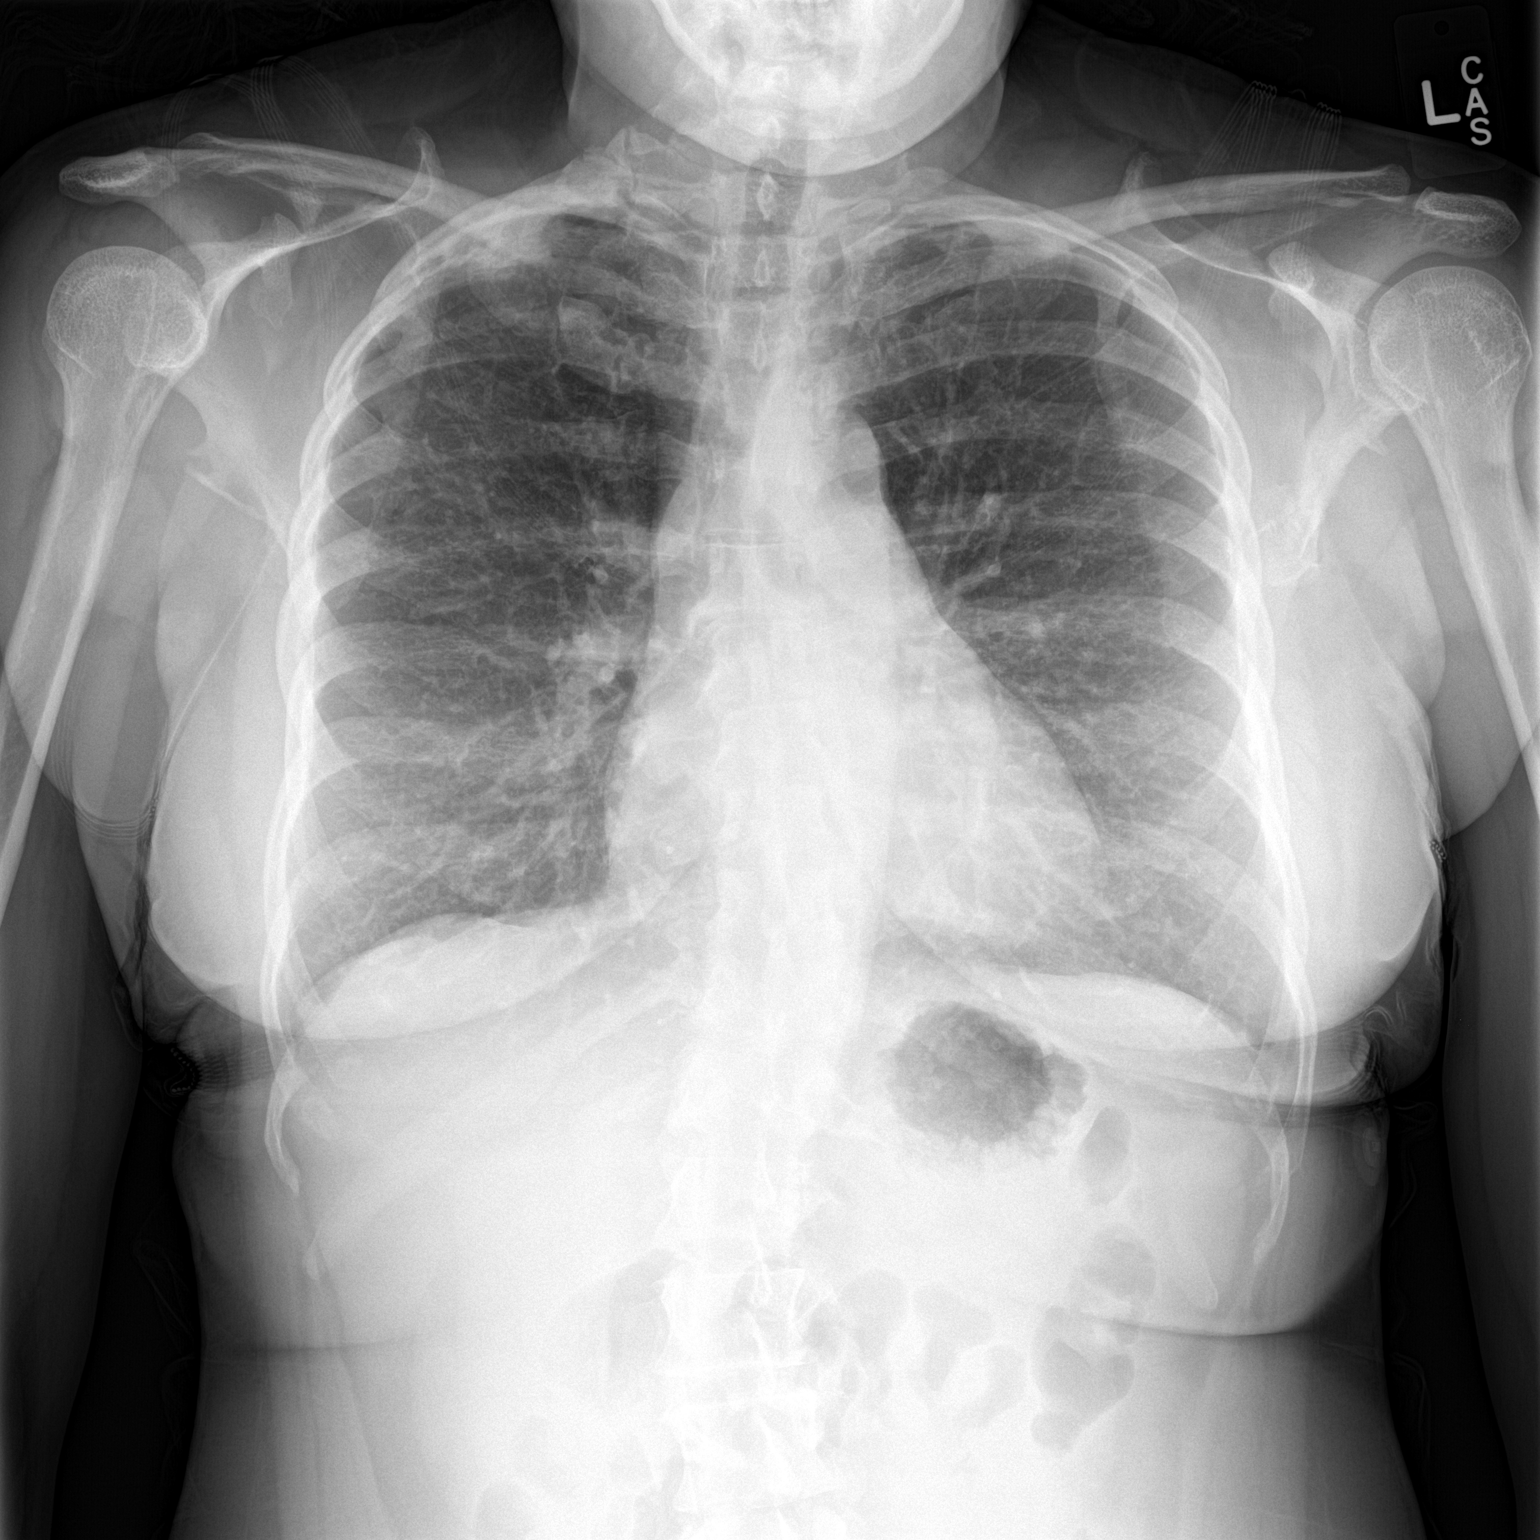

[chest lat]
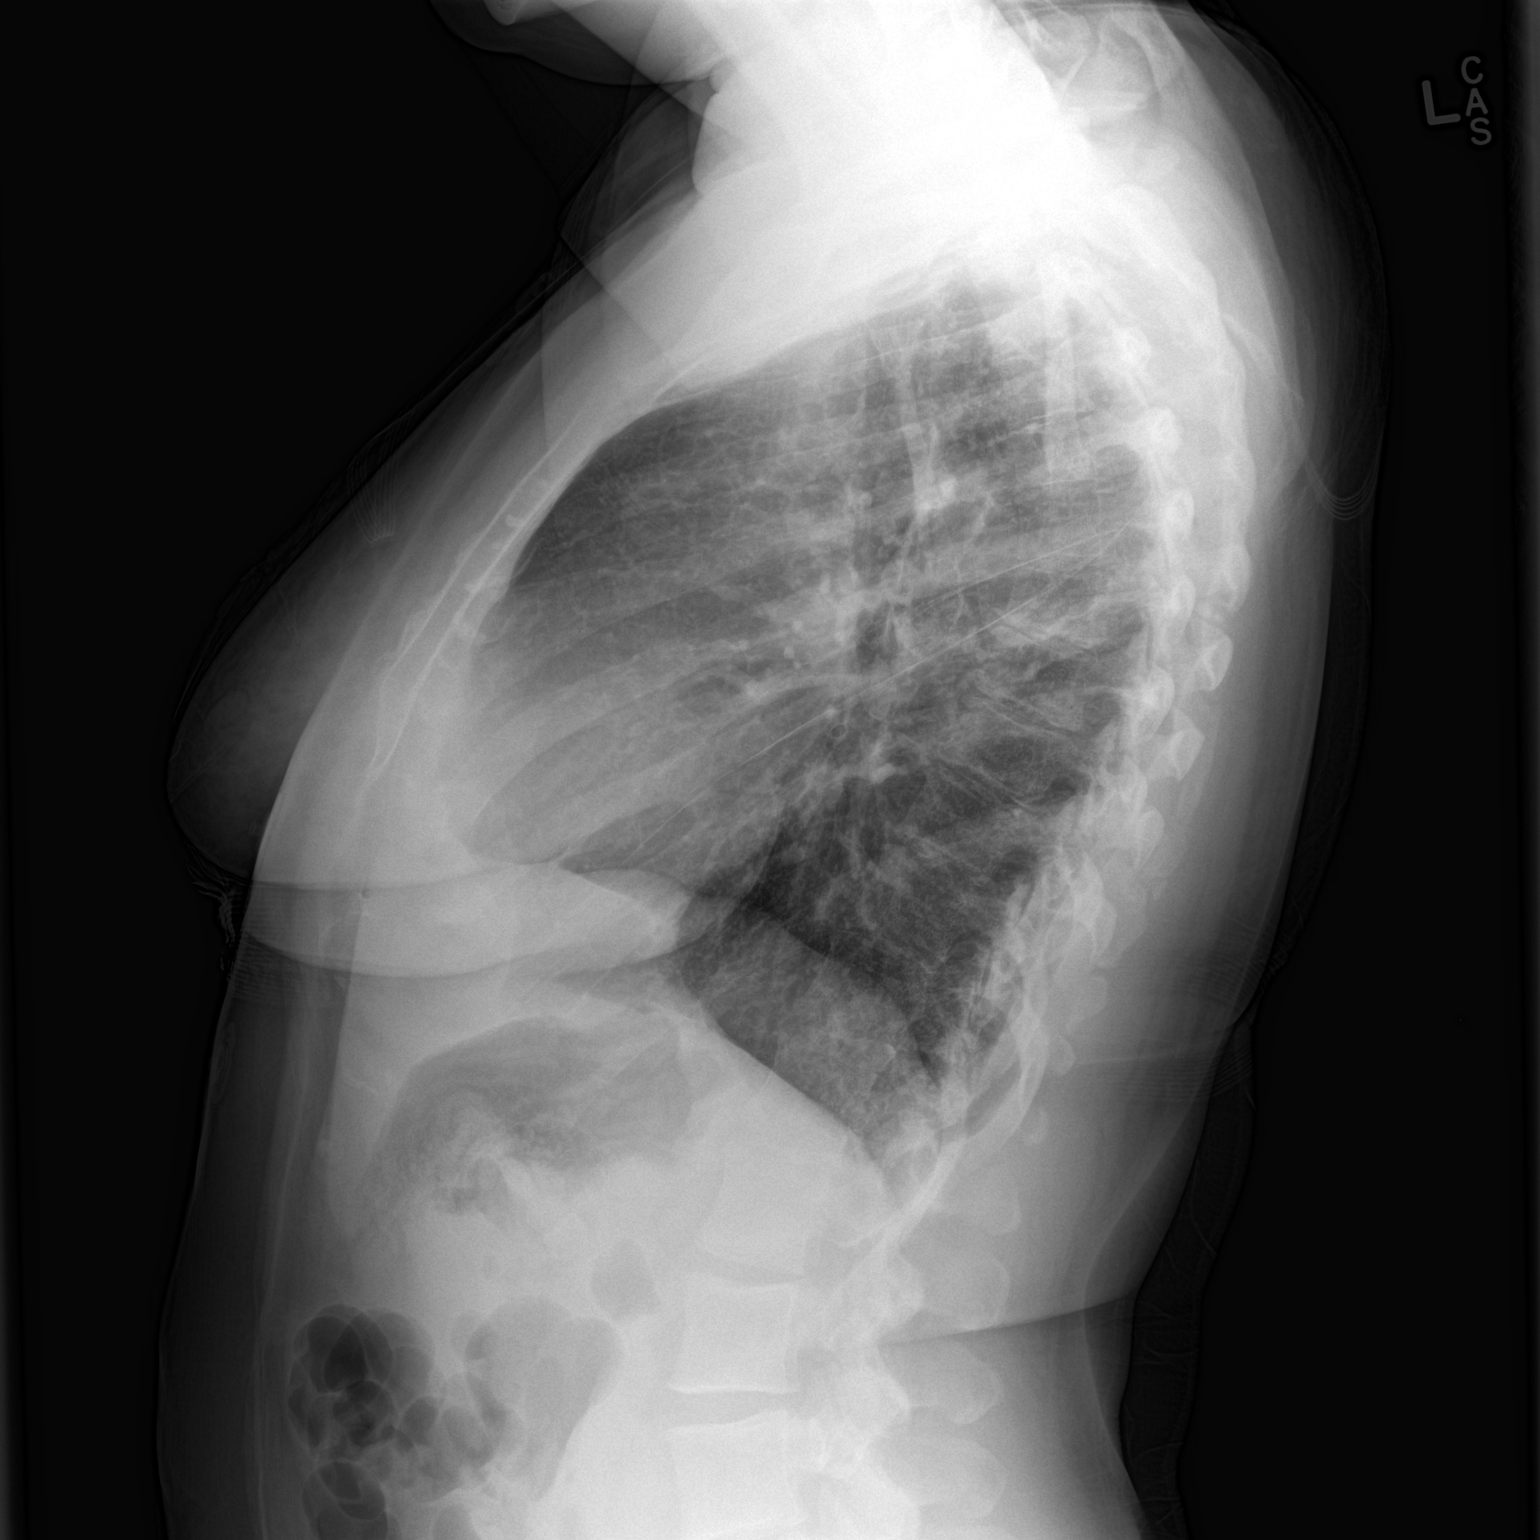

[2 of 2 positions shown; findings below may reference images not displayed]

FINDINGS: Overall lung volumes are within normal limits. Normal cardiac size
and mediastinal contours. Visualized tracheal air column is within
normal limits. There is confluent pulmonary opacity in the right
apex which most resembles pleural/parenchymal confluent scarring.
Less pronounced left apical opacity. No pneumothorax, pulmonary
edema or pleural effusion. No other confluent pulmonary opacity, but
there are increased interstitial markings in both lungs.
Degenerative changes in the thoracic spine including endplate
osteophytosis. Enthesophyte ptosis versus chronic fractures at the
body of both scapula a.
IMPRESSION: 1. Abnormal bilateral lung parenchyma, but favor chronic in nature
including a 2-3 cm area in the right lung apex which most resembles
pleural/parenchymal scarring.
2. No pneumothorax, pulmonary edema or pleural effusion identified.

## 2016-01-11 ENCOUNTER — Emergency Department (HOSPITAL_COMMUNITY)
Admission: EM | Admit: 2016-01-11 | Discharge: 2016-01-11 | Disposition: A | Discharge status: Home / Self Care | Payer: Medicare Other | Attending: Emergency Medicine | Admitting: Emergency Medicine

## 2016-01-11 ENCOUNTER — Encounter (HOSPITAL_COMMUNITY): Payer: Self-pay | Admitting: Emergency Medicine

## 2016-01-11 DIAGNOSIS — F17210 Nicotine dependence, cigarettes, uncomplicated: Secondary | ICD-10-CM | POA: Insufficient documentation

## 2016-01-11 DIAGNOSIS — J029 Acute pharyngitis, unspecified: Secondary | ICD-10-CM | POA: Diagnosis present

## 2016-01-11 DIAGNOSIS — M1990 Unspecified osteoarthritis, unspecified site: Secondary | ICD-10-CM | POA: Insufficient documentation

## 2016-01-11 DIAGNOSIS — F329 Major depressive disorder, single episode, unspecified: Secondary | ICD-10-CM | POA: Diagnosis not present

## 2016-01-11 LAB — RAPID STREP SCREEN (MED CTR MEBANE ONLY): STREPTOCOCCUS, GROUP A SCREEN (DIRECT): NEGATIVE

## 2016-01-11 MED ORDER — LIDOCAINE VISCOUS 2 % MT SOLN
20.0000 mL | Freq: Once | OROMUCOSAL | Status: DC
  Filled 2016-01-11: qty 20

## 2016-01-11 MED ORDER — LIDOCAINE VISCOUS 2 % MT SOLN: 15.0000 mL | OROMUCOSAL | Status: AC | PRN

## 2016-01-11 NOTE — ED Notes (Addendum)
Pt states that she has had a sore throat x 2 days. States she went to the clinic yesterday and was given an antibiotic for sinus infection and ear infection but still does not feel well. Alert and oriented.
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
## 2016-01-11 NOTE — Discharge Instructions (Signed)
For pain control please take ibuprofen (also known as Motrin or Advil) 800mg  (this is normally 4 over the counter pills) 3 times a day  for 5 days. Take with food to minimize stomach irritation.  Please follow with your primary care doctor in the next 2 days for a check-up. They must obtain records for further management.   Do not hesitate to return to the Emergency Department for any new, worsening or concerning symptoms.   Pharyngitis Pharyngitis is redness, pain, and swelling (inflammation) of your pharynx.  CAUSES  Pharyngitis is usually caused by infection. Most of the time, these infections are from viruses (viral) and are part of a cold. However, sometimes pharyngitis is caused by bacteria (bacterial). Pharyngitis can also be caused by allergies. Viral pharyngitis may be spread from person to person by coughing, sneezing, and personal items or utensils (cups, forks, spoons, toothbrushes). Bacterial pharyngitis may be spread from person to person by more intimate contact, such as kissing.  SIGNS AND SYMPTOMS  Symptoms of pharyngitis include:   Sore throat.   Tiredness (fatigue).   Low-grade fever.   Headache.  Joint pain and muscle aches.  Skin rashes.  Swollen lymph nodes.  Plaque-like film on throat or tonsils (often seen with bacterial pharyngitis). DIAGNOSIS  Your health care provider will ask you questions about your illness and your symptoms. Your medical history, along with a physical exam, is often all that is needed to diagnose pharyngitis. Sometimes, a rapid strep test is done. Other lab tests may also be done, depending on the suspected cause.  TREATMENT  Viral pharyngitis will usually get better in 3-4 days without the use of medicine. Bacterial pharyngitis is treated with medicines that kill germs (antibiotics).  HOME CARE INSTRUCTIONS   Drink enough water and fluids to keep your urine clear or pale yellow.   Only take over-the-counter or prescription  medicines as directed by your health care provider:   If you are prescribed antibiotics, make sure you finish them even if you start to feel better.   Do not take aspirin.   Get lots of rest.   Gargle with 8 oz of salt water ( tsp of salt per 1 qt of water) as often as every 1-2 hours to soothe your throat.   Throat lozenges (if you are not at risk for choking) or sprays may be used to soothe your throat. SEEK MEDICAL CARE IF:   You have large, tender lumps in your neck.  You have a rash.  You cough up green, yellow-brown, or bloody spit. SEEK IMMEDIATE MEDICAL CARE IF:   Your neck becomes stiff.  You drool or are unable to swallow liquids.  You vomit or are unable to keep medicines or liquids down.  You have severe pain that does not go away with the use of recommended medicines.  You have trouble breathing (not caused by a stuffy nose). MAKE SURE YOU:   Understand these instructions.  Will watch your condition.  Will get help right away if you are not doing well or get worse.   This information is not intended to replace advice given to you by your health care provider. Make sure you discuss any questions you have with your health care provider.   Document Released: 07/10/2005 Document Revised: 04/30/2013 Document Reviewed: 03/17/2013 Elsevier Interactive Patient Education [employer]2016 Elsevier Inc.

## 2016-01-13 ENCOUNTER — Other Ambulatory Visit: Payer: Self-pay

## 2016-01-13 ENCOUNTER — Emergency Department (HOSPITAL_COMMUNITY): Payer: Medicare Other

## 2016-01-13 ENCOUNTER — Encounter (HOSPITAL_COMMUNITY): Payer: Self-pay | Admitting: Emergency Medicine

## 2016-01-13 DIAGNOSIS — Z5321 Procedure and treatment not carried out due to patient leaving prior to being seen by health care provider: Secondary | ICD-10-CM | POA: Insufficient documentation

## 2016-01-13 DIAGNOSIS — J029 Acute pharyngitis, unspecified: Secondary | ICD-10-CM | POA: Insufficient documentation

## 2016-01-13 DIAGNOSIS — R079 Chest pain, unspecified: Secondary | ICD-10-CM | POA: Insufficient documentation

## 2016-01-13 LAB — CBC
HEMATOCRIT: 38.1 % (ref 36.0–46.0)
HEMOGLOBIN: 12.6 g/dL (ref 12.0–15.0)
MCH: 30 pg (ref 26.0–34.0)
MCHC: 33.1 g/dL (ref 30.0–36.0)
MCV: 90.7 fL (ref 78.0–100.0)
Platelets: 270 10*3/uL (ref 150–400)
RBC: 4.2 MIL/uL (ref 3.87–5.11)
RDW: 13.8 % (ref 11.5–15.5)
WBC: 8.4 10*3/uL (ref 4.0–10.5)

## 2016-01-13 LAB — BASIC METABOLIC PANEL
ANION GAP: 8 (ref 5–15)
BUN: 6 mg/dL (ref 6–20)
CHLORIDE: 103 mmol/L (ref 101–111)
CO2: 27 mmol/L (ref 22–32)
Calcium: 9.7 mg/dL (ref 8.9–10.3)
Creatinine, Ser: 0.73 mg/dL (ref 0.44–1.00)
GFR calc non Af Amer: >60 mL/min (ref >60)
Glucose, Bld: 88 mg/dL (ref 65–99)
POTASSIUM: 3.3 mmol/L — AB (ref 3.5–5.1)
SODIUM: 138 mmol/L (ref 135–145)

## 2016-01-13 LAB — I-STAT TROPONIN, ED: Troponin i, poc: 0 ng/mL (ref 0.00–0.08)

## 2016-01-13 NOTE — ED Notes (Signed)
Pt ehre with sore throat x 3 days and CP starting at approx 2100 today. Pt denies fever, SOB, n/v/d, diaphoresis. Pt a/o x 4.

## 2016-01-14 ENCOUNTER — Emergency Department (HOSPITAL_COMMUNITY)
Admission: EM | Admit: 2016-01-14 | Discharge: 2016-01-14 | Disposition: A | Discharge status: Home / Self Care | Payer: Medicare Other | Attending: Dermatology | Admitting: Dermatology

## 2016-01-14 LAB — CULTURE, GROUP A STREP (THRC)

## 2016-01-14 NOTE — ED Notes (Signed)
Patient didn't answer after being called several times.Marland Kitchen..Marland Kitchen

## 2016-06-26 ENCOUNTER — Emergency Department (HOSPITAL_COMMUNITY)
Admission: EM | Admit: 2016-06-26 | Discharge: 2016-06-26 | Disposition: A | Discharge status: Home / Self Care | Payer: Medicare Other | Attending: Emergency Medicine | Admitting: Emergency Medicine

## 2016-06-26 ENCOUNTER — Encounter (HOSPITAL_COMMUNITY): Payer: Self-pay | Admitting: Emergency Medicine

## 2016-06-26 ENCOUNTER — Emergency Department (HOSPITAL_COMMUNITY): Payer: Medicare Other

## 2016-06-26 DIAGNOSIS — J069 Acute upper respiratory infection, unspecified: Secondary | ICD-10-CM | POA: Insufficient documentation

## 2016-06-26 DIAGNOSIS — F17210 Nicotine dependence, cigarettes, uncomplicated: Secondary | ICD-10-CM | POA: Insufficient documentation

## 2016-06-26 DIAGNOSIS — R05 Cough: Secondary | ICD-10-CM | POA: Diagnosis present

## 2016-06-26 DIAGNOSIS — E876 Hypokalemia: Secondary | ICD-10-CM | POA: Diagnosis not present

## 2016-06-26 LAB — COMPREHENSIVE METABOLIC PANEL
ALT: 16 U/L (ref 14–54)
ANION GAP: 9 (ref 5–15)
AST: 21 U/L (ref 15–41)
Albumin: 4.5 g/dL (ref 3.5–5.0)
Alkaline Phosphatase: 101 U/L (ref 38–126)
BILIRUBIN TOTAL: 0.6 mg/dL (ref 0.3–1.2)
BUN: 6 mg/dL (ref 6–20)
CHLORIDE: 102 mmol/L (ref 101–111)
CO2: 27 mmol/L (ref 22–32)
Calcium: 9.4 mg/dL (ref 8.9–10.3)
Creatinine, Ser: 0.57 mg/dL (ref 0.44–1.00)
Glucose, Bld: 126 mg/dL — ABNORMAL HIGH (ref 65–99)
POTASSIUM: 3 mmol/L — AB (ref 3.5–5.1)
Sodium: 138 mmol/L (ref 135–145)
TOTAL PROTEIN: 7.5 g/dL (ref 6.5–8.1)

## 2016-06-26 LAB — CBC
HCT: 37.3 % (ref 36.0–46.0)
HEMOGLOBIN: 12.6 g/dL (ref 12.0–15.0)
MCH: 30.7 pg (ref 26.0–34.0)
MCHC: 33.8 g/dL (ref 30.0–36.0)
MCV: 91 fL (ref 78.0–100.0)
PLATELETS: 311 10*3/uL (ref 150–400)
RBC: 4.1 MIL/uL (ref 3.87–5.11)
RDW: 13.6 % (ref 11.5–15.5)
WBC: 9.3 10*3/uL (ref 4.0–10.5)

## 2016-06-26 LAB — POC OCCULT BLOOD, ED: FECAL OCCULT BLD: NEGATIVE

## 2016-06-26 LAB — I-STAT TROPONIN, ED: TROPONIN I, POC: 0 ng/mL (ref 0.00–0.08)

## 2016-06-26 MED ORDER — MAGNESIUM OXIDE 400 (241.3 MG) MG PO TABS
800.0000 mg | ORAL_TABLET | Freq: Once | ORAL | Status: AC
  Administered 2016-06-26: 800 mg via ORAL
  Filled 2016-06-26: qty 2

## 2016-06-26 MED ORDER — AZITHROMYCIN 250 MG PO TABS
500.0000 mg | ORAL_TABLET | Freq: Once | ORAL | Status: AC
  Administered 2016-06-26: 500 mg via ORAL
  Filled 2016-06-26: qty 2

## 2016-06-26 MED ORDER — POTASSIUM CHLORIDE CRYS ER 20 MEQ PO TBCR
80.0000 meq | EXTENDED_RELEASE_TABLET | Freq: Once | ORAL | Status: AC
  Administered 2016-06-26: 80 meq via ORAL
  Filled 2016-06-26: qty 4

## 2016-06-26 MED ORDER — BENZONATATE 100 MG PO CAPS: 100.0000 mg | ORAL_CAPSULE | Freq: Three times a day (TID) | ORAL | 0 refills | Status: DC

## 2016-06-26 MED ORDER — AZITHROMYCIN 250 MG PO TABS: 250.0000 mg | ORAL_TABLET | Freq: Every day | ORAL | 0 refills | Status: DC

## 2016-06-26 MED ORDER — DEXTROMETHORPHAN HBR 15 MG/5ML PO SYRP: 10.0000 mL | ORAL_SOLUTION | Freq: Four times a day (QID) | ORAL | 0 refills | Status: DC | PRN

## 2016-06-26 NOTE — ED Triage Notes (Signed)
Per EMS, pt c/o cough with yellow mucus and blood, nasal congestion, chills x 2 weeks, diarrhea x 2 days. Bright red blood in stool onset this morning. No weakness, dizziness.

## 2016-06-26 NOTE — ED Notes (Signed)
Bed: WA02 Expected date:  Expected time:  Means of arrival:  Comments: EMS-cough
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
## 2016-06-26 NOTE — ED Notes (Signed)
RN at bedside

## 2016-06-26 NOTE — Discharge Instructions (Signed)
Take tylenol 2 pills 4 times a day and motrin 4 pills 3 times a day.  Drink plenty of fluids.  Return for worsening shortness of breath, headache, confusion. Follow up with your family doctor.   

## 2016-09-27 ENCOUNTER — Other Ambulatory Visit (HOSPITAL_COMMUNITY): Payer: Self-pay | Admitting: Internal Medicine

## 2016-09-27 DIAGNOSIS — R51 Headache: Principal | ICD-10-CM

## 2016-09-27 DIAGNOSIS — R519 Headache, unspecified: Secondary | ICD-10-CM

## 2016-10-02 ENCOUNTER — Ambulatory Visit (HOSPITAL_COMMUNITY)
Admission: RE | Admit: 2016-10-02 | Discharge: 2016-10-02 | Disposition: A | Discharge status: Home / Self Care | Payer: Medicare Other | Source: Ambulatory Visit | Attending: Internal Medicine | Admitting: Internal Medicine

## 2016-10-04 ENCOUNTER — Encounter (HOSPITAL_COMMUNITY): Payer: Self-pay

## 2016-10-04 ENCOUNTER — Ambulatory Visit (HOSPITAL_COMMUNITY)
Admission: RE | Admit: 2016-10-04 | Discharge: 2016-10-04 | Disposition: A | Discharge status: Home / Self Care | Payer: Medicare Other | Source: Ambulatory Visit | Attending: Internal Medicine | Admitting: Internal Medicine

## 2016-11-01 IMAGING — CR DG CHEST 2V
2 series · 2 of 2 positions shown · non-contrast
Comparison: 01/15/2015

CLINICAL DATA: Cough, shortness of breath for 2 days, history of
smoking

EXAM:
CHEST  2 VIEW

[w chest pa]
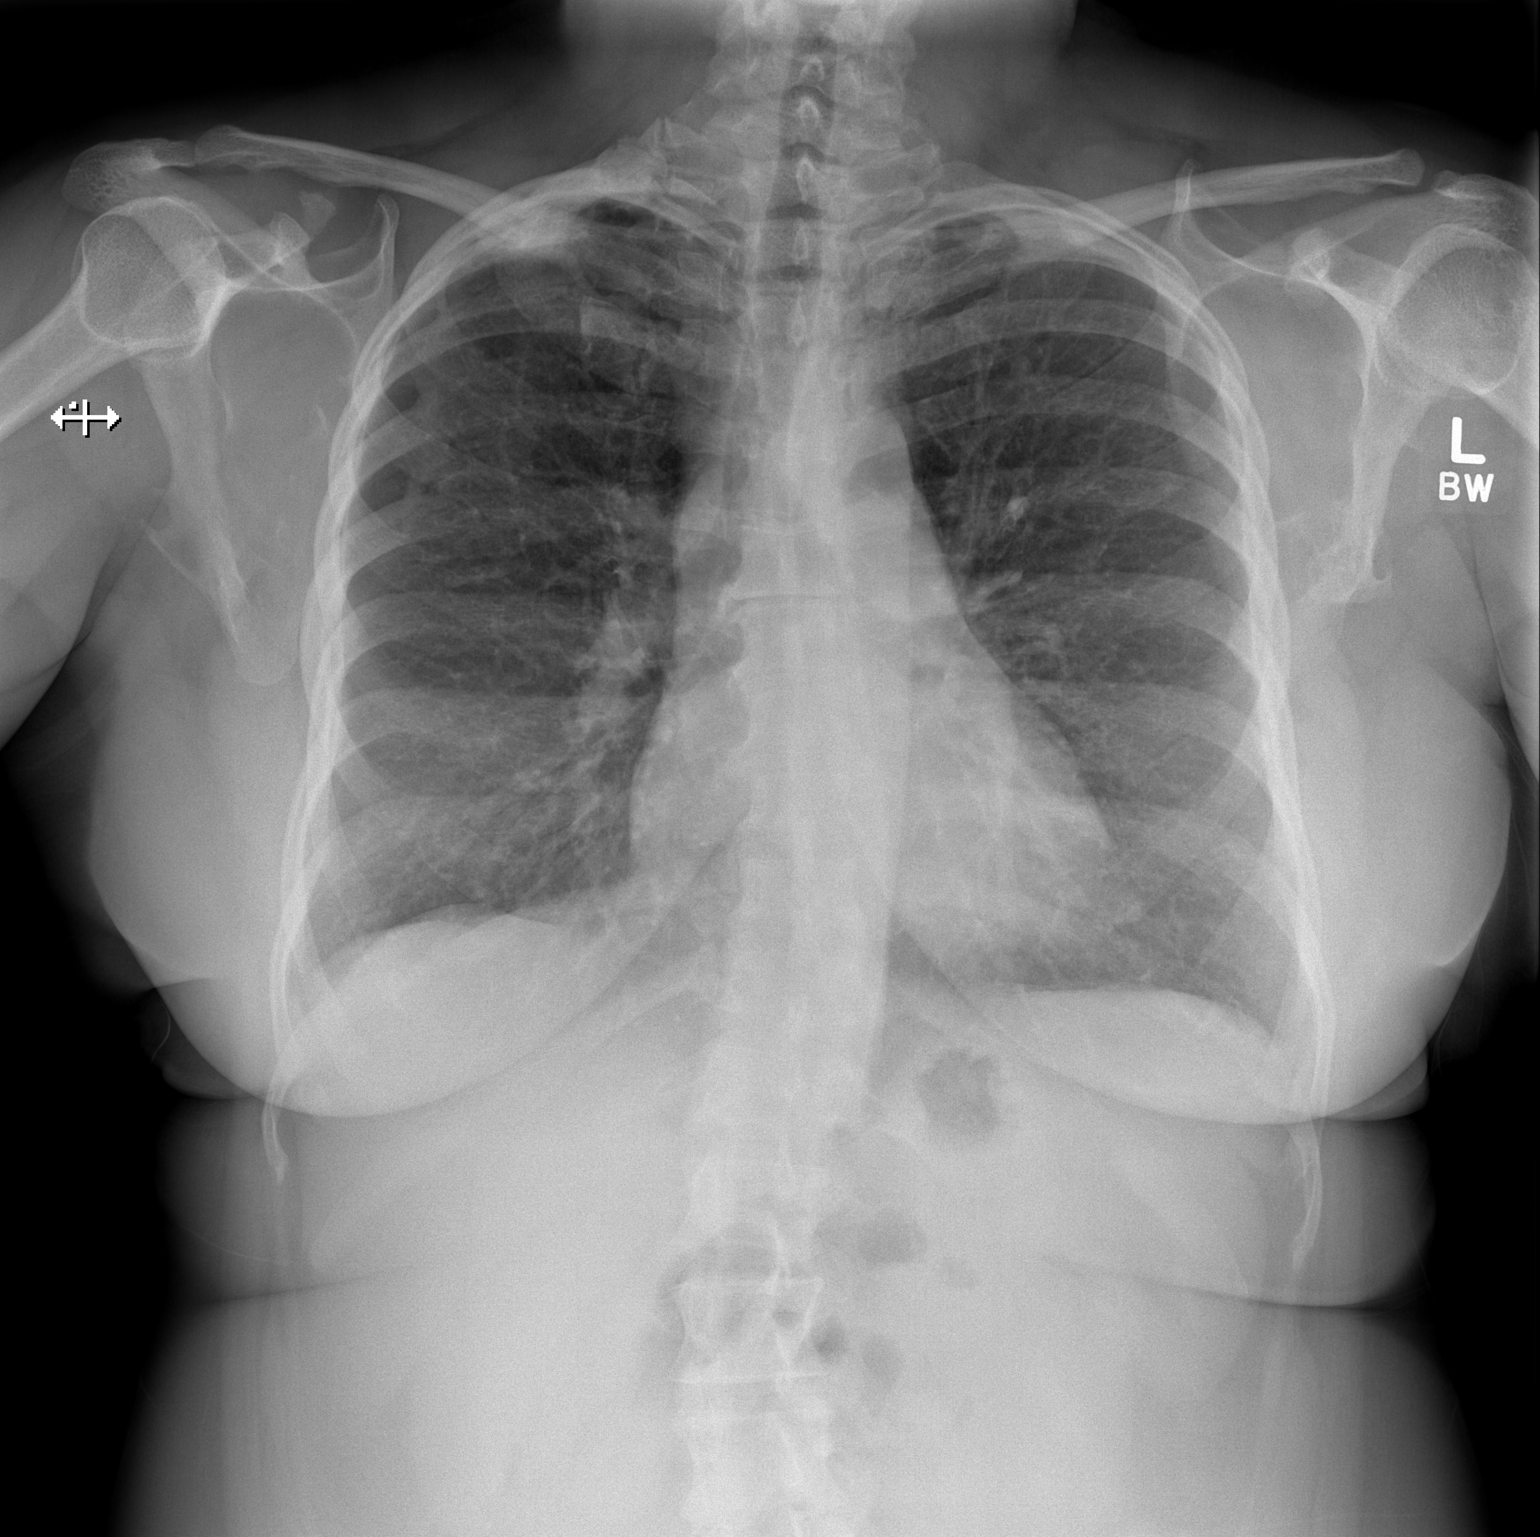

[w chest lat]
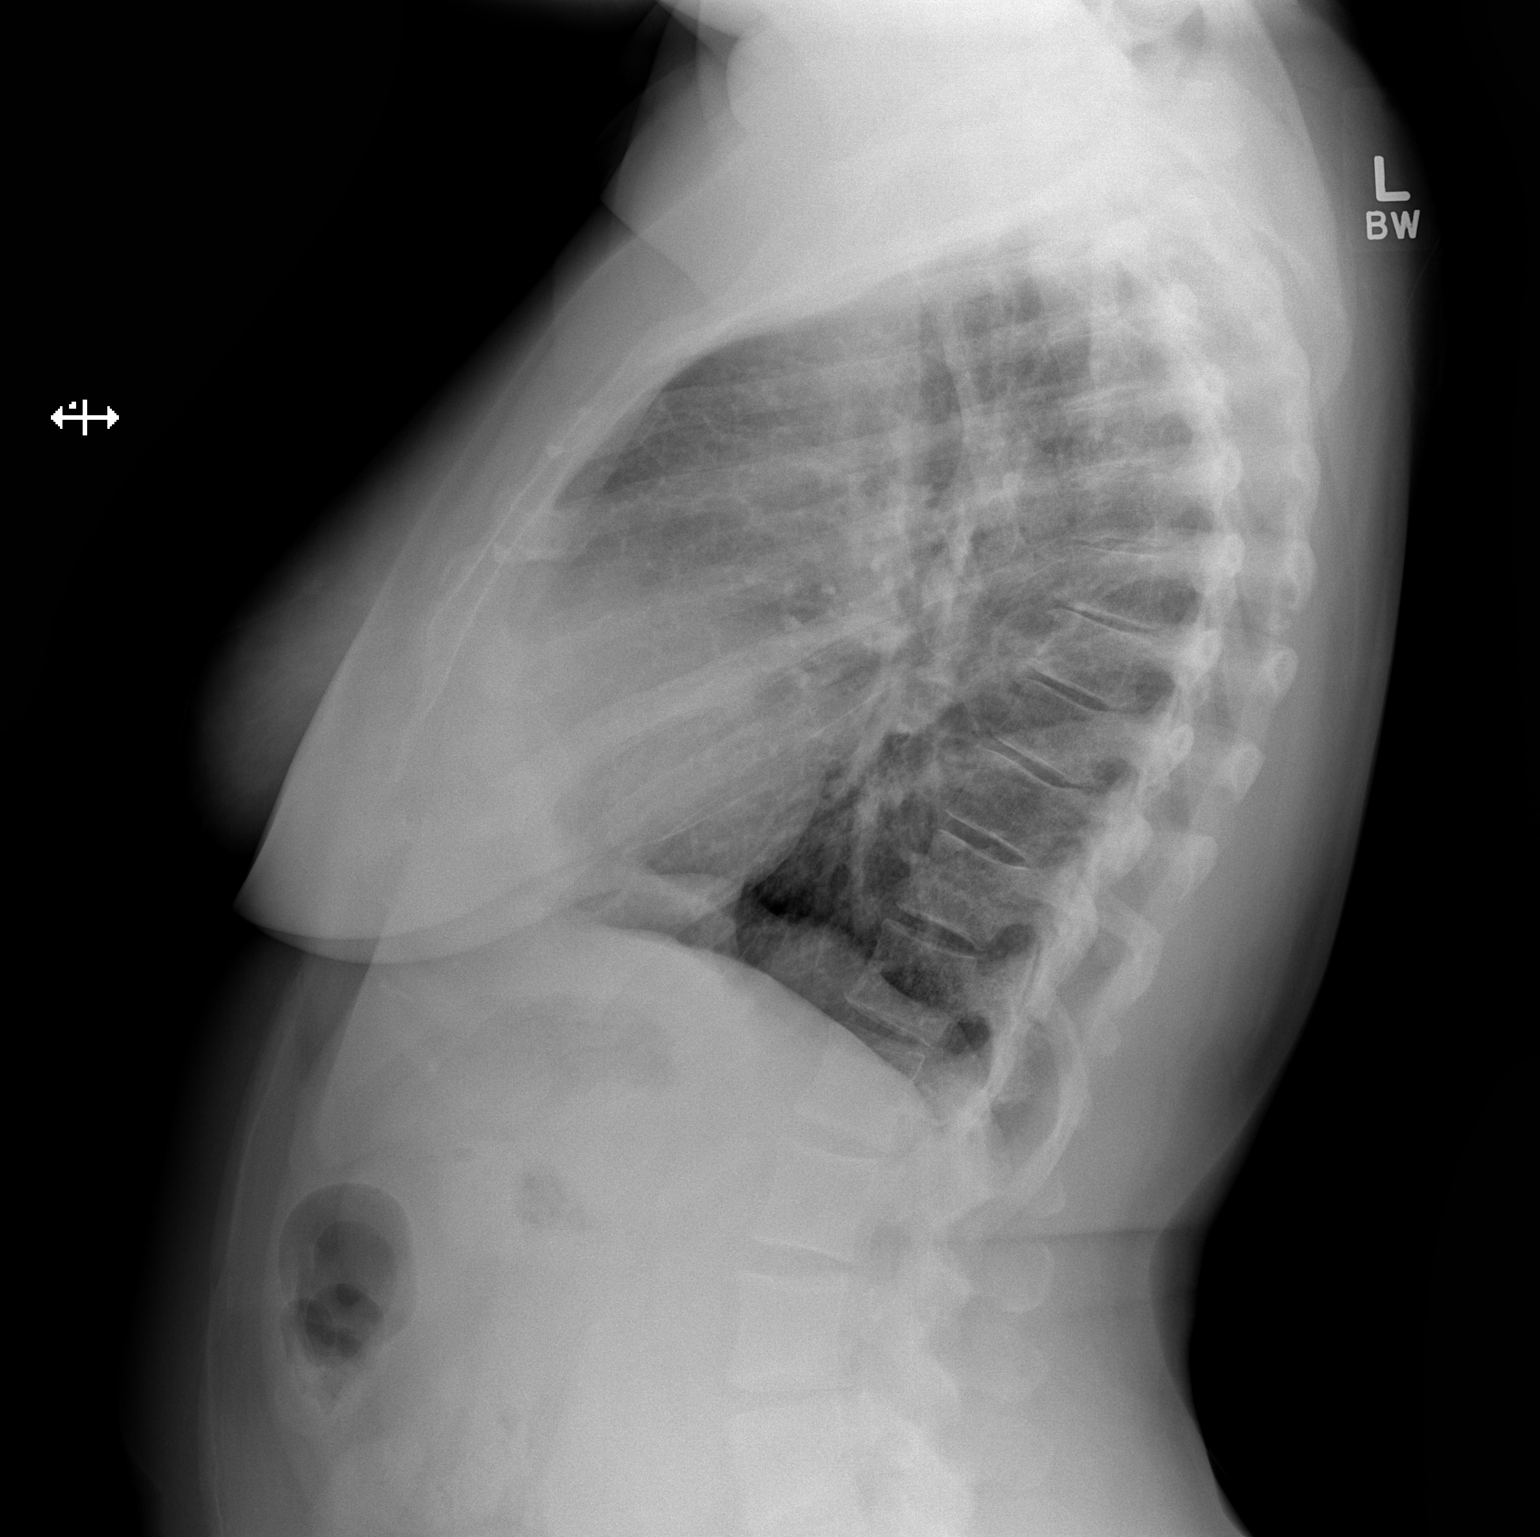

[2 of 2 positions shown; findings below may reference images not displayed]

FINDINGS: Cardiomediastinal silhouette is stable. No infiltrate or pulmonary
edema. Mild lower levoscoliosis again noted. Mild perihilar
bronchitic changes. Stable partial rib fusion right upper posterior
ribs. Enthesopathy bilateral scapula is stable.
IMPRESSION: No infiltrate or pulmonary edema. Mild perihilar bronchitic changes.

## 2017-07-28 DIAGNOSIS — K0889 Other specified disorders of teeth and supporting structures: Secondary | ICD-10-CM | POA: Diagnosis not present

## 2017-07-28 DIAGNOSIS — F17210 Nicotine dependence, cigarettes, uncomplicated: Secondary | ICD-10-CM | POA: Insufficient documentation

## 2017-07-28 DIAGNOSIS — Z79899 Other long term (current) drug therapy: Secondary | ICD-10-CM | POA: Insufficient documentation

## 2017-07-29 ENCOUNTER — Emergency Department (HOSPITAL_COMMUNITY)
Admission: EM | Admit: 2017-07-29 | Discharge: 2017-07-29 | Disposition: A | Discharge status: Home / Self Care | Payer: Medicare Other | Attending: Emergency Medicine | Admitting: Emergency Medicine

## 2017-07-29 ENCOUNTER — Other Ambulatory Visit: Payer: Self-pay

## 2017-07-29 ENCOUNTER — Encounter (HOSPITAL_COMMUNITY): Payer: Self-pay | Admitting: Emergency Medicine

## 2017-07-29 DIAGNOSIS — K0889 Other specified disorders of teeth and supporting structures: Secondary | ICD-10-CM

## 2017-07-29 MED ORDER — OXYCODONE-ACETAMINOPHEN 5-325 MG PO TABS
1.0000 | ORAL_TABLET | Freq: Once | ORAL | Status: AC
  Administered 2017-07-29: 1 via ORAL
  Filled 2017-07-29: qty 1

## 2017-07-29 NOTE — ED Triage Notes (Signed)
Pt reports having tooth pulled 07/26/17 and since then has been having pain into jaw and also has white patches on right lower portion of mouth. Pt currently on antibiotics for infection

## 2017-07-29 NOTE — Discharge Instructions (Signed)
Please read attached information. If you experience any new or worsening signs or symptoms please return to the emergency room for evaluation. Please follow-up with your primary care provider or specialist as discussed.  °
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
## 2017-11-15 ENCOUNTER — Other Ambulatory Visit: Payer: Self-pay | Admitting: Internal Medicine

## 2017-11-15 DIAGNOSIS — Z1231 Encounter for screening mammogram for malignant neoplasm of breast: Secondary | ICD-10-CM

## 2017-11-27 ENCOUNTER — Ambulatory Visit
Admission: RE | Admit: 2017-11-27 | Discharge: 2017-11-27 | Disposition: A | Discharge status: Home / Self Care | Payer: Medicare Other | Source: Ambulatory Visit | Attending: Internal Medicine | Admitting: Internal Medicine

## 2017-11-27 DIAGNOSIS — Z1231 Encounter for screening mammogram for malignant neoplasm of breast: Secondary | ICD-10-CM

## 2017-11-28 ENCOUNTER — Encounter (HOSPITAL_COMMUNITY): Payer: Self-pay | Admitting: Family Medicine

## 2017-11-28 ENCOUNTER — Ambulatory Visit (HOSPITAL_COMMUNITY)
Admission: EM | Admit: 2017-11-28 | Discharge: 2017-11-28 | Disposition: A | Discharge status: Home / Self Care | Payer: Medicare Other | Attending: Family Medicine | Admitting: Family Medicine

## 2017-11-28 DIAGNOSIS — R519 Headache, unspecified: Secondary | ICD-10-CM

## 2017-11-28 DIAGNOSIS — R51 Headache: Secondary | ICD-10-CM

## 2017-11-28 MED ORDER — IBUPROFEN 800 MG PO TABS: 800.0000 mg | ORAL_TABLET | Freq: Three times a day (TID) | ORAL | 0 refills | Status: AC

## 2017-11-28 NOTE — ED Triage Notes (Signed)
Pt here for a bump to the back of her head. She said it has been 4 days. Painful to touch. No injury
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
## 2018-03-11 ENCOUNTER — Other Ambulatory Visit: Payer: Self-pay

## 2018-03-11 ENCOUNTER — Encounter (HOSPITAL_COMMUNITY): Payer: Self-pay | Admitting: Emergency Medicine

## 2018-03-11 ENCOUNTER — Ambulatory Visit (HOSPITAL_COMMUNITY)
Admission: EM | Admit: 2018-03-11 | Discharge: 2018-03-11 | Disposition: A | Discharge status: Home / Self Care | Payer: Medicare Other

## 2018-03-11 DIAGNOSIS — M545 Low back pain, unspecified: Secondary | ICD-10-CM

## 2018-03-11 DIAGNOSIS — G8929 Other chronic pain: Secondary | ICD-10-CM | POA: Diagnosis not present

## 2018-03-11 MED ORDER — MELOXICAM 15 MG PO TABS: 15.0000 mg | ORAL_TABLET | Freq: Every day | ORAL | 0 refills | Status: AC

## 2018-03-11 NOTE — Discharge Instructions (Signed)
Stop ibuprofen and restart daily meloxicam. Take with food.  Please continue to follow up with primary care provider for continued management of your chronic pain.  Please reach out to pain management to establish care.

## 2018-03-11 NOTE — ED Triage Notes (Signed)
Pain in back and joints for 2 weeks .  This is chronic pain that has been managed by dr Minerva Areolaeric dean, has since retired .  New doctor has referred her to pain management
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
## 2019-04-13 ENCOUNTER — Emergency Department (HOSPITAL_COMMUNITY)
Admission: EM | Admit: 2019-04-13 | Discharge: 2019-04-13 | Disposition: A | Discharge status: Home / Self Care | Payer: Medicare HMO | Attending: Emergency Medicine | Admitting: Emergency Medicine

## 2019-04-13 ENCOUNTER — Encounter (HOSPITAL_COMMUNITY): Payer: Self-pay | Admitting: Emergency Medicine

## 2019-04-13 ENCOUNTER — Emergency Department (HOSPITAL_COMMUNITY): Payer: Medicare HMO

## 2019-04-13 ENCOUNTER — Other Ambulatory Visit: Payer: Self-pay

## 2019-04-13 DIAGNOSIS — R0789 Other chest pain: Secondary | ICD-10-CM | POA: Diagnosis not present

## 2019-04-13 DIAGNOSIS — Z5321 Procedure and treatment not carried out due to patient leaving prior to being seen by health care provider: Secondary | ICD-10-CM | POA: Insufficient documentation

## 2019-04-13 LAB — BASIC METABOLIC PANEL
Anion gap: 9 (ref 5–15)
BUN: <5 mg/dL — ABNORMAL LOW (ref 6–20)
CO2: 26 mmol/L (ref 22–32)
Calcium: 9.2 mg/dL (ref 8.9–10.3)
Chloride: 104 mmol/L (ref 98–111)
Creatinine, Ser: 0.64 mg/dL (ref 0.44–1.00)
GFR calc Af Amer: >60 mL/min (ref >60)
GFR calc non Af Amer: >60 mL/min (ref >60)
Glucose, Bld: 102 mg/dL — ABNORMAL HIGH (ref 70–99)
Potassium: 2.9 mmol/L — ABNORMAL LOW (ref 3.5–5.1)
Sodium: 139 mmol/L (ref 135–145)

## 2019-04-13 LAB — CBC
HCT: 36.8 % (ref 36.0–46.0)
Hemoglobin: 12.4 g/dL (ref 12.0–15.0)
MCH: 31.6 pg (ref 26.0–34.0)
MCHC: 33.7 g/dL (ref 30.0–36.0)
MCV: 93.6 fL (ref 80.0–100.0)
Platelets: 250 10*3/uL (ref 150–400)
RBC: 3.93 MIL/uL (ref 3.87–5.11)
RDW: 13.7 % (ref 11.5–15.5)
WBC: 9.4 10*3/uL (ref 4.0–10.5)
nRBC: 0 % (ref 0.0–0.2)

## 2019-04-13 LAB — I-STAT BETA HCG BLOOD, ED (MC, WL, AP ONLY): I-stat hCG, quantitative: <5 m[IU]/mL (ref <5)

## 2019-04-13 LAB — TROPONIN I (HIGH SENSITIVITY): Troponin I (High Sensitivity): 2 ng/L (ref <18)

## 2019-04-13 MED ORDER — SODIUM CHLORIDE 0.9% FLUSH: 3.0000 mL | Freq: Once | INTRAVENOUS | Status: DC

## 2019-04-13 NOTE — ED Notes (Signed)
Pt stated she had called her husband and she was leaving.  Pts saline lock removed by staff.

## 2019-04-13 NOTE — ED Notes (Signed)
Pt states that will be leaving.

## 2019-04-13 NOTE — ED Triage Notes (Addendum)
Pt BIB [facility] from home, c/o right chest pressure that radiates to her back. Reports intermittent shortness of breath. Pt took asa prior to EMS arrival, given 1 NTG by EMS, pain decreased from a 10 to a 5.

## 2019-10-28 ENCOUNTER — Other Ambulatory Visit: Payer: Self-pay | Admitting: Physician Assistant

## 2019-10-28 DIAGNOSIS — Z1231 Encounter for screening mammogram for malignant neoplasm of breast: Secondary | ICD-10-CM

## 2019-10-28 DIAGNOSIS — M8588 Other specified disorders of bone density and structure, other site: Secondary | ICD-10-CM

## 2020-04-17 ENCOUNTER — Encounter (HOSPITAL_COMMUNITY): Payer: Self-pay | Admitting: Emergency Medicine

## 2020-04-17 ENCOUNTER — Other Ambulatory Visit: Payer: Self-pay

## 2020-04-17 ENCOUNTER — Emergency Department (HOSPITAL_COMMUNITY): Payer: Medicare HMO

## 2020-04-17 DIAGNOSIS — K0889 Other specified disorders of teeth and supporting structures: Secondary | ICD-10-CM | POA: Insufficient documentation

## 2020-04-17 DIAGNOSIS — R0789 Other chest pain: Secondary | ICD-10-CM | POA: Diagnosis not present

## 2020-04-17 DIAGNOSIS — F17210 Nicotine dependence, cigarettes, uncomplicated: Secondary | ICD-10-CM | POA: Diagnosis not present

## 2020-04-17 LAB — CBC
HCT: 40.1 % (ref 36.0–46.0)
Hemoglobin: 13.1 g/dL (ref 12.0–15.0)
MCH: 30.5 pg (ref 26.0–34.0)
MCHC: 32.7 g/dL (ref 30.0–36.0)
MCV: 93.5 fL (ref 80.0–100.0)
Platelets: 322 10*3/uL (ref 150–400)
RBC: 4.29 MIL/uL (ref 3.87–5.11)
RDW: 15.5 % (ref 11.5–15.5)
WBC: 14.4 10*3/uL — ABNORMAL HIGH (ref 4.0–10.5)
nRBC: 0 % (ref 0.0–0.2)

## 2020-04-17 LAB — I-STAT BETA HCG BLOOD, ED (NOT ORDERABLE): I-stat hCG, quantitative: <5 m[IU]/mL (ref <5)

## 2020-04-17 LAB — BASIC METABOLIC PANEL
Anion gap: 15 (ref 5–15)
BUN: 10 mg/dL (ref 6–20)
CO2: 21 mmol/L — ABNORMAL LOW (ref 22–32)
Calcium: 9.5 mg/dL (ref 8.9–10.3)
Chloride: 99 mmol/L (ref 98–111)
Creatinine, Ser: 0.72 mg/dL (ref 0.44–1.00)
GFR calc Af Amer: >60 mL/min (ref >60)
GFR calc non Af Amer: >60 mL/min (ref >60)
Glucose, Bld: 105 mg/dL — ABNORMAL HIGH (ref 70–99)
Potassium: 3 mmol/L — ABNORMAL LOW (ref 3.5–5.1)
Sodium: 135 mmol/L (ref 135–145)

## 2020-04-17 LAB — TROPONIN I (HIGH SENSITIVITY): Troponin I (High Sensitivity): 3 ng/L (ref <18)

## 2020-04-17 NOTE — ED Triage Notes (Signed)
Patient states that she was seen at urgent care today for multiple dental caries and prescribed augmentin. Patient states around an hour ago, she began having chest pain under her left breast.

## 2020-04-18 ENCOUNTER — Emergency Department (HOSPITAL_COMMUNITY)
Admission: EM | Admit: 2020-04-18 | Discharge: 2020-04-18 | Disposition: A | Discharge status: Home / Self Care | Payer: Medicare HMO | Attending: Emergency Medicine | Admitting: Emergency Medicine

## 2020-04-18 DIAGNOSIS — K0889 Other specified disorders of teeth and supporting structures: Secondary | ICD-10-CM

## 2020-04-18 DIAGNOSIS — R0789 Other chest pain: Secondary | ICD-10-CM

## 2020-04-18 LAB — TROPONIN I (HIGH SENSITIVITY): Troponin I (High Sensitivity): 3 ng/L (ref <18)

## 2020-04-18 MED ORDER — NAPROXEN 500 MG PO TABS: 500.0000 mg | ORAL_TABLET | Freq: Two times a day (BID) | ORAL | 0 refills | Status: AC

## 2020-04-18 MED ORDER — NAPROXEN 500 MG PO TABS
500.0000 mg | ORAL_TABLET | Freq: Once | ORAL | Status: AC
  Administered 2020-04-18: 500 mg via ORAL
  Filled 2020-04-18: qty 1

## 2020-04-18 MED ORDER — OXYCODONE-ACETAMINOPHEN 5-325 MG PO TABS
1.0000 | ORAL_TABLET | Freq: Once | ORAL | Status: AC
  Administered 2020-04-18: 1 via ORAL
  Filled 2020-04-18: qty 1

## 2020-04-18 NOTE — Discharge Instructions (Signed)
You were seen today mostly for dental pain.  It is very important that you follow-up with a dentist.  Continue your Augmentin.  Take naproxen twice daily as needed for pain.  Regarding your chest pain, your work-up is reassuring.  It does not appear that you are having a heart attack and your chest x-ray is clear.
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
## 2020-04-18 NOTE — ED Notes (Signed)
Ice pack has been provided.

## 2021-01-03 ENCOUNTER — Ambulatory Visit (HOSPITAL_COMMUNITY)
Admission: EM | Admit: 2021-01-03 | Discharge: 2021-01-03 | Disposition: A | Discharge status: Home / Self Care | Payer: Medicare Other | Attending: Emergency Medicine | Admitting: Emergency Medicine

## 2021-01-03 ENCOUNTER — Other Ambulatory Visit: Payer: Self-pay

## 2021-01-03 ENCOUNTER — Encounter (HOSPITAL_COMMUNITY): Payer: Self-pay

## 2021-01-03 ENCOUNTER — Ambulatory Visit (HOSPITAL_COMMUNITY): Payer: Self-pay

## 2021-01-03 DIAGNOSIS — W57XXXA Bitten or stung by nonvenomous insect and other nonvenomous arthropods, initial encounter: Secondary | ICD-10-CM | POA: Diagnosis not present

## 2021-01-03 DIAGNOSIS — S70361A Insect bite (nonvenomous), right thigh, initial encounter: Secondary | ICD-10-CM | POA: Diagnosis not present

## 2021-01-03 MED ORDER — TRIAMCINOLONE ACETONIDE 0.1 % EX CREA: 1.0000 "application " | TOPICAL_CREAM | Freq: Four times a day (QID) | CUTANEOUS | 0 refills | Status: AC

## 2021-01-03 MED ORDER — CETIRIZINE HCL 10 MG PO TABS: 40.0000 mg | ORAL_TABLET | Freq: Every day | ORAL | 0 refills | Status: AC

## 2021-01-03 NOTE — ED Triage Notes (Signed)
Pt reports pain and swelling in the right thigh x 1 day. Reports she was bite by an insect.
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
## 2022-08-08 ENCOUNTER — Encounter (HOSPITAL_COMMUNITY): Payer: Self-pay

## 2022-08-08 ENCOUNTER — Emergency Department (HOSPITAL_COMMUNITY)
Admission: EM | Admit: 2022-08-08 | Discharge: 2022-08-08 | Disposition: A | Discharge status: Home / Self Care | Payer: Medicare PPO | Attending: Emergency Medicine | Admitting: Emergency Medicine

## 2022-08-08 ENCOUNTER — Other Ambulatory Visit: Payer: Self-pay

## 2022-08-08 ENCOUNTER — Emergency Department (HOSPITAL_COMMUNITY): Payer: Medicare PPO

## 2022-08-08 DIAGNOSIS — E876 Hypokalemia: Secondary | ICD-10-CM | POA: Insufficient documentation

## 2022-08-08 DIAGNOSIS — R0789 Other chest pain: Secondary | ICD-10-CM | POA: Diagnosis present

## 2022-08-08 DIAGNOSIS — R002 Palpitations: Secondary | ICD-10-CM | POA: Diagnosis not present

## 2022-08-08 DIAGNOSIS — D649 Anemia, unspecified: Secondary | ICD-10-CM | POA: Diagnosis not present

## 2022-08-08 DIAGNOSIS — R011 Cardiac murmur, unspecified: Secondary | ICD-10-CM | POA: Diagnosis not present

## 2022-08-08 LAB — BASIC METABOLIC PANEL
Anion gap: 10 (ref 5–15)
BUN: 8 mg/dL (ref 6–20)
CO2: 26 mmol/L (ref 22–32)
Calcium: 9 mg/dL (ref 8.9–10.3)
Chloride: 102 mmol/L (ref 98–111)
Creatinine, Ser: 0.63 mg/dL (ref 0.44–1.00)
GFR, Estimated: >60 mL/min (ref >60)
Glucose, Bld: 93 mg/dL (ref 70–99)
Potassium: 3.1 mmol/L — ABNORMAL LOW (ref 3.5–5.1)
Sodium: 138 mmol/L (ref 135–145)

## 2022-08-08 LAB — CBC
HCT: 36.4 % (ref 36.0–46.0)
Hemoglobin: 11.9 g/dL — ABNORMAL LOW (ref 12.0–15.0)
MCH: 30.4 pg (ref 26.0–34.0)
MCHC: 32.7 g/dL (ref 30.0–36.0)
MCV: 92.9 fL (ref 80.0–100.0)
Platelets: 263 10*3/uL (ref 150–400)
RBC: 3.92 MIL/uL (ref 3.87–5.11)
RDW: 14.5 % (ref 11.5–15.5)
WBC: 6.4 10*3/uL (ref 4.0–10.5)
nRBC: 0 % (ref 0.0–0.2)

## 2022-08-08 LAB — TROPONIN I (HIGH SENSITIVITY): Troponin I (High Sensitivity): <2 ng/L (ref <18)

## 2022-08-08 NOTE — ED Triage Notes (Addendum)
Patient said the center of her chest feels like it flutters. Her doctor said her EKG was abnormal and to come to the ED. No nausea, vomiting or radiation. Patient smokes cigarettes 1 pack a day for a year.

## 2022-08-08 NOTE — ED Provider Notes (Signed)
[facility]-EMERGENCY DEPT Provider Note   CSN: [id number] Arrival date & time: 08/08/22  1149     History  Chief Complaint  Patient presents with   Chest Pain    Joanne Daugherty is a 47 y.o. female.  The history is provided by the patient and medical records. No language interpreter was used.  Chest Pain Pain location:  Substernal area and L chest Pain quality comment:  Palpiations Pain radiates to:  Does not radiate Pain severity:  Moderate Onset quality:  Gradual Duration:  2 weeks Timing:  Sporadic Progression:  Waxing and waning Chronicity:  Recurrent Context: stress   Relieved by:  Nothing Worsened by:  Nothing Ineffective treatments:  None tried Associated symptoms: palpitations   Associated symptoms: no abdominal pain, no altered mental status, no anxiety, no back pain, no claudication, no cough, no diaphoresis, no dizziness, no fatigue, no fever, no headache, no lower extremity edema, no nausea, no near-syncope, no numbness, no shortness of breath, no vomiting and no weakness   Risk factors: no coronary artery disease, no diabetes mellitus, no hypertension, not female and no prior DVT/PE        Home Medications Prior to Admission medications   Medication Sig Start Date End Date Taking? Authorizing Provider  acetaminophen (TYLENOL) 325 MG tablet Take 650 mg by mouth every 6 (six) hours as needed.    [provider]  amoxicillin-clavulanate (AUGMENTIN) 875-125 MG tablet Take 1 tablet by mouth 2 (two) times daily. 04/17/20   [provider]  busPIRone (BUSPAR) 10 MG tablet Take 10 mg by mouth 3 (three) times daily. 03/10/20   [provider]  cetirizine (ZYRTEC) 10 MG tablet Take 4 tablets (40 mg total) by mouth daily for 5 days. 01/03/21 01/08/21  Cuthriell, Delorise Royals, PA-C  cyclobenzaprine (FLEXERIL) 10 MG tablet Take 1 tablet (10 mg total) by mouth 2 (two) times daily as needed for muscle spasms. 05/22/15   Janne Napoleon, NP  ibuprofen (ADVIL,MOTRIN) 800 MG tablet Take 1 tablet (800 mg total) by mouth 3 (three) times daily. 11/28/17   Mardella Layman, MD  lidocaine (XYLOCAINE) 2 % solution Use as directed 15 mLs in the mouth or throat every 3 (three) hours as needed. 01/11/16   Pisciotta, Joni Reining, PA-C  losartan (COZAAR) 50 MG tablet Take 50 mg by mouth daily.    [provider]  meloxicam (MOBIC) 15 MG tablet Take 1 tablet (15 mg total) by mouth daily. 03/11/18   Georgetta Haber, NP  mirtazapine (REMERON) 15 MG tablet Take 15 mg by mouth daily. 02/19/20   [provider]  naproxen (NAPROSYN) 500 MG tablet Take 1 tablet (500 mg total) by mouth 2 (two) times daily. 04/18/20   Horton, Mayer Masker, MD  oxyCODONE (ROXICODONE) 5 MG immediate release tablet Take 1 tablet (5 mg total) by mouth every 4 (four) hours as needed for severe pain. Patient not taking: No sig reported 05/22/15   Janne Napoleon, NP  oxyCODONE-acetaminophen (PERCOCET/ROXICET) 5-325 MG tablet Take 1 tablet by mouth 2 (two) times daily. RAN OUT OF THIS 08/25/15   [provider]  PARoxetine (PAXIL) 10 MG tablet Take 10 mg by mouth daily.    [provider]  QUEtiapine (SEROQUEL) 100 MG tablet Take 100 mg by mouth at bedtime. 03/10/20   [provider]  triamcinolone cream (KENALOG) 0.1 % Apply 1 application topically 4 (four) times daily. 01/03/21   Cuthriell, Delorise Royals, PA-C  Allergies    Topamax [topiramate], Tramadol, Hydrocodone, and Ketorolac tromethamine    Review of Systems   Review of Systems  Constitutional:  Negative for chills, diaphoresis, fatigue and fever.  HENT:  Negative for congestion.   Respiratory:  Negative for cough, chest tightness, shortness of breath and wheezing.   Cardiovascular:  Positive for chest pain and palpitations. Negative for claudication and near-syncope.  Gastrointestinal:  Negative for abdominal pain, constipation, diarrhea, nausea and vomiting.  Genitourinary:   Negative for dysuria and flank pain.  Musculoskeletal:  Negative for back pain, neck pain and neck stiffness.  Skin:  Negative for rash and wound.  Neurological:  Negative for dizziness, syncope, weakness, light-headedness, numbness and headaches.  Psychiatric/Behavioral:  Negative for agitation and confusion.   All other systems reviewed and are negative.   Physical Exam Updated Vital Signs BP (!) 171/110 (BP Location: Left Arm)   Pulse 85   Temp 98.2 F (36.8 C) (Oral)   Resp 19   SpO2 100%  Physical Exam Vitals and nursing note reviewed.  Constitutional:      General: She is not in acute distress.    Appearance: She is well-developed. She is not ill-appearing, toxic-appearing or diaphoretic.  HENT:     Head: Normocephalic and atraumatic.  Eyes:     Conjunctiva/sclera: Conjunctivae normal.     Pupils: Pupils are equal, round, and reactive to light.  Cardiovascular:     Rate and Rhythm: Normal rate and regular rhythm.     Heart sounds: Murmur heard.  Pulmonary:     Effort: Pulmonary effort is normal. No respiratory distress.     Breath sounds: Normal breath sounds. No decreased breath sounds, wheezing, rhonchi or rales.  Chest:     Chest wall: No tenderness.  Abdominal:     Palpations: Abdomen is soft.     Tenderness: There is no abdominal tenderness.  Musculoskeletal:        General: No swelling.     Cervical back: Neck supple.     Right lower leg: No tenderness. No edema.     Left lower leg: No tenderness. No edema.  Skin:    General: Skin is warm and dry.     Capillary Refill: Capillary refill takes less than 2 seconds.     Findings: No erythema.  Neurological:     General: No focal deficit present.     Mental Status: She is alert.  Psychiatric:        Mood and Affect: Mood normal.     ED Results / Procedures / Treatments   Labs (all labs ordered are listed, but only abnormal results are displayed) Labs Reviewed  BASIC METABOLIC PANEL - Abnormal; Notable  for the following components:      Result Value   Potassium 3.1 (*)    All other components within normal limits  CBC - Abnormal; Notable for the following components:   Hemoglobin 11.9 (*)    All other components within normal limits  TROPONIN I (HIGH SENSITIVITY)  TROPONIN I (HIGH SENSITIVITY)    EKG EKG Interpretation  Date/Time:  Tuesday August 08 2022 11:56:04 EST Ventricular Rate:  84 PR Interval:  177 QRS Duration: 101 QT Interval:  362 QTC Calculation: 428 R Axis:   45 Text Interpretation: Sinus rhythm No significant change since last tracing Confirmed by Jacalyn Lefevre [phone]) on 08/08/2022 1:49:52 PM  Radiology DG Chest 2 View  Result Date: 08/08/2022 CLINICAL DATA:  Chest pain. EXAM: CHEST - 2 VIEW COMPARISON:  Chest x-ray 04/17/2020. FINDINGS: The heart size and mediastinal contours are within normal limits. Both lungs are clear. No visible pleural effusions or pneumothorax. No acute osseous abnormality. IMPRESSION: No active cardiopulmonary disease. Electronically Signed   By: Margaretha Sheffield M.D.   On: 08/08/2022 12:43    Procedures Procedures    Medications Ordered in ED Medications - No data to display  ED Course/ Medical Decision Making/ A&P                             Medical Decision Making Amount and/or Complexity of Data Reviewed Labs: ordered. Radiology: ordered.    Tarin Katharina Jehle is a 47 y.o. female with a past medical history significant for previous depression and arthritis who presents with palpitations and chest discomfort.  According to patient, for the last 2 weeks she has had palpitations and some discomfort.  It last for several seconds at a time and goes away.  Initially it was every few days but now it is every few hours.  She reports it is never constant and she was sent for evaluation.  She reports no recent trauma and denies any shortness of breath, nausea, vomiting, or diaphoresis.  Denies syncope.  Denies lightheadedness.  She  reports a mild cough but it is not persistent.  She has not seen a cardiologist and does not have any family history of heart disease that she knows of.  She reports she has had more stress recently and had similar palpitations years ago when her mother passed away and she was stressed.  On exam, lungs were clear and chest was nontender.  He does have a subtle systolic murmur.  Good pulses in all extremities.  Legs nontender nonedematous.  Denies history of DVT or PE.  Abdomen nontender.  Back nontender.  Patient well-appearing with out hypoxia tachycardia or tachypnea.  She is afebrile.  EKG does not show STEMI.  No PVCs or significant arrhythmias.  Patient had workup starting in triage including a troponin that was undetectable and negative.  She had evidence of persistent hypokalemia with potassium 3.1 similar to what it has been over the last few years.  We offered potassium supplementation orally but she refused.  She also was offered a second troponin and a TSH but she did not want to wait for further labs as she has somewhere to go.  Her CBC was reassuring with only mild anemia.  Chest x-ray reassuring.  Clinical aspect she has some mild hypokalemia and stress leading to her palpitations.  We discussed repeat labs but she does not want this.  She also does not want oral potassium dilatation.  She was given information on hypokalemia and palpitations and chest pain and will follow-up with outpatient cardiology.  She was given an amatory referral to cardiology for recurrent palpitations and understood return precautions.  She had no other questions or concerns and was discharged in good condition after otherwise reassuring workup aside from hypokalemia.         Final Clinical Impression(s) / ED Diagnoses Final diagnoses:  Palpitations  Atypical chest pain  Hypokalemia    Rx / DC Orders ED Discharge Orders          Ordered    Ambulatory referral to Cardiology       Comments:  Palpitations   08/08/22 1509           Clinical Impression: 1. Palpitations   2. Atypical chest pain  3. Hypokalemia     Disposition: Discharge  Condition: Good  I have discussed the results, Dx and Tx plan with the pt(& family if present). He/she/they expressed understanding and agree(s) with the plan. Discharge instructions discussed at great length. Strict return precautions discussed and pt &/or family have verbalized understanding of the instructions. No further questions at time of discharge.    Discharge Medication List as of 08/08/2022  3:11 PM      Follow Up: [place] [facility] [place] [id number] [phone]    [address], [place] [id number] [facility] [place] [id number] [address] [place]       Illianna Paschal, Gwenyth Allegra, MD 08/08/22 1525

## 2022-08-08 NOTE — Discharge Instructions (Signed)
Your history, exam, workup today was overall reassuring however we did find your potassium was low today.  We offered oral potassium repletion but you did not want to take the large pills.  Increase the potassium in your diet as we discussed and follow-up with outpatient cardiology.  Your heart enzyme was reassuring and we together agreed to hold on more extensive lab work at this time.  Your x-ray did not show pneumonia.  If any symptoms change or worsen acutely, please return to the nearest emergency department.  Please rest and stay hydrated.

## 2022-08-08 NOTE — ED Provider Triage Note (Signed)
Emergency Medicine Provider Triage Evaluation Note  Joanne Daugherty , a 47 y.o. female  was evaluated in triage.  Pt complains of episodic fluttering in the chest over the last 2 weeks.  Not triggered by exertion.  Reports increased life stressors.  Had this same sensation when her mother passed away.  Denies shortness of breath, fever, cough, N/V/D.  Review of Systems  Positive:  Negative: See above  Physical Exam  BP (!) 171/110 (BP Location: Left Arm)   Pulse 85   Temp 98.2 F (36.8 C) (Oral)   Resp 19   SpO2 100%  Gen:   Awake, no distress, tearful Resp:  Normal effort  MSK:   Moves extremities without difficulty  Other:  Comfortably.  Abdomen soft, nontender.  Not diaphoretic.  Medical Decision Making  Medically screening exam initiated at 12:14 PM.  Appropriate orders placed.  Joanne Daugherty was informed that the remainder of the evaluation will be completed by another provider, this initial triage assessment does not replace that evaluation, and the importance of remaining in the ED until their evaluation is complete.  BP observed at 154/104.   Prince Rome, PA-C 60/63/01 1219
